# Patient Record
Sex: Female | Born: 1960 | Race: White | Hispanic: No | Marital: Married | Smoking: Never smoker
Health system: Southern US, Community
[De-identification: ages and names within clinical notes are randomized; demographics above are authoritative.]

## PROBLEM LIST (undated history)

## (undated) HISTORY — PX: KNEE SURGERY: SHX244

---

## 2018-07-29 HISTORY — PX: KNEE SURGERY: SHX244

## 2022-01-15 ENCOUNTER — Ambulatory Visit
Admission: EM | Admit: 2022-01-15 | Discharge: 2022-01-15 | Disposition: A | Discharge status: Home / Self Care | Payer: BC Managed Care – PPO | Source: Home / Self Care

## 2022-01-15 DIAGNOSIS — M79602 Pain in left arm: Secondary | ICD-10-CM | POA: Insufficient documentation

## 2022-01-15 DIAGNOSIS — Z20822 Contact with and (suspected) exposure to covid-19: Secondary | ICD-10-CM | POA: Insufficient documentation

## 2022-01-15 DIAGNOSIS — J069 Acute upper respiratory infection, unspecified: Secondary | ICD-10-CM

## 2022-01-15 DIAGNOSIS — A409 Streptococcal sepsis, unspecified: Secondary | ICD-10-CM | POA: Diagnosis not present

## 2022-01-15 DIAGNOSIS — A419 Sepsis, unspecified organism: Secondary | ICD-10-CM | POA: Diagnosis not present

## 2022-01-15 LAB — RESP PANEL BY RT-PCR (FLU A&B, COVID) ARPGX2
Influenza A by PCR: NEGATIVE
Influenza B by PCR: NEGATIVE
SARS Coronavirus 2 by RT PCR: NEGATIVE

## 2022-01-15 MED ORDER — ACETAMINOPHEN 325 MG PO TABS
650.0000 mg | ORAL_TABLET | Freq: Once | ORAL | Status: AC
Start: 2022-01-15 — End: 2022-01-15
  Administered 2022-01-15: 650 mg via ORAL

## 2022-01-15 NOTE — Discharge Instructions (Addendum)
-  COVID and flu swabs negative -given fever and other symptoms, likely a viral upper respiratory infection -can alternate ibuprofen and Tylenol every four hours for pain and fever -over the counter medications for symptom management -push fluids -follow up with PCP as needed

## 2022-01-15 NOTE — ED Provider Notes (Signed)
[facility]    CSN: [id number] Arrival date & time: 01/15/22  1556      History   Chief Complaint Chief Complaint  Patient presents with   Shortness of Breath   Generalized Body Aches   Fatigue   Numbness    Arm numbness     HPI Molly Krueger is a 61 y.o. female.   Patient is a 61 year old female who presents with chief complaint of fever, lethargy, left arm pain and chills.  Patient states that chills have resolved but continues to have fever, lethargy, and left arm pain.  Reports taking ibuprofen mostly for her arm pain as well as low back pain that she had earlier as well.  Patient denies any runny nose or sore throat.  Patient also denies chest pain, shortness of breath, Donnell pain, nausea or dizziness.  Patient states that she has had no sick contacts that she knows of.  She did not get a flu shot this past year but states she has had full set of for COVID shots.  She is not allergic to any medicines that she knows of.    History reviewed. No pertinent past medical history.  There are no problems to display for this patient.   Past Surgical History:  Procedure Laterality Date   KNEE SURGERY Left     OB History   No obstetric history on file.      Home Medications    Prior to Admission medications   Not on File    Family History History reviewed. No pertinent family history.  Social History Social History   Tobacco Use   Smoking status: Never   Smokeless tobacco: Never  Vaping Use   Vaping Use: Never used  Substance Use Topics   Alcohol use: Never   Drug use: Never     Allergies   Patient has no known allergies.   Review of Systems Review of Systems as noted above in HPI.  Other systems reviewed and found to be negative   Physical Exam Triage Vital Signs ED Triage Vitals  Enc Vitals Group     BP 01/15/22 1722 93/69     Pulse Rate 01/15/22 1722 (!) 108     Resp 01/15/22 1722 18     Temp 01/15/22 1722 (!) 101 F  (38.3 C)     Temp Source 01/15/22 1722 Oral     SpO2 01/15/22 1722 95 %     Weight --      Height --      Head Circumference --      Peak Flow --      Pain Score 01/15/22 1719 0     Pain Loc --      Pain Edu? --      Excl. in GC? --    No data found.  Updated Vital Signs BP 93/69 (BP Location: Left Arm)   Pulse (!) 108   Temp (!) 101 F (38.3 C) (Oral)   Resp 18   SpO2 95%     Physical Exam Constitutional:      Appearance: Normal appearance. She is well-developed.  HENT:     Right Ear: Tympanic membrane normal.     Left Ear: Tympanic membrane normal.     Mouth/Throat:     Mouth: Mucous membranes are moist.     Pharynx: Oropharynx is clear.  Cardiovascular:     Rate and Rhythm: Regular rhythm. Tachycardia present.  Pulmonary:     Effort: Pulmonary  effort is normal.     Breath sounds: Normal breath sounds. No wheezing or rhonchi.  Musculoskeletal:     Cervical back: Normal range of motion.  Lymphadenopathy:     Cervical: No cervical adenopathy.  Skin:    General: Skin is warm.     Capillary Refill: Capillary refill takes less than 2 seconds.  Neurological:     General: No focal deficit present.     Mental Status: She is alert and oriented to person, place, and time.  Psychiatric:        Mood and Affect: Mood normal.        Behavior: Behavior normal.      UC Treatments / Results  Labs (all labs ordered are listed, but only abnormal results are displayed) Labs Reviewed  RESP PANEL BY RT-PCR (FLU A&B, COVID) [id number]  SARS CORONAVIRUS 2 (TAT 6-24 HRS)    EKG   Radiology No results found.  Procedures Procedures (including critical care time)  Medications Ordered in UC Medications  acetaminophen (TYLENOL) tablet 650 mg (650 mg Oral Given 01/15/22 1729)    Initial Impression / Assessment and Plan / UC Course  I have reviewed the triage vital signs and the nursing notes.  Pertinent labs & imaging results that were available during my care of the  patient were reviewed by me and considered in my medical decision making (see chart for details).     Patient presents with temperature of 101.  Patient reporting viral symptoms for 5 days.  She denies any sick contacts that she knows of.  Main complaints of fever, lethargy, left arm pain.  Viral swab obtained, and results negative. Will recommend symptom management for viral illness.  Final Clinical Impressions(s) / UC Diagnoses   Final diagnoses:  Viral URI     Discharge Instructions      -COVID and flu swabs negative -given fever and other symptoms, likely a viral upper respiratory infection -can alternate ibuprofen and Tylenol every four hours for pain and fever -over the counter medications for symptom management -push fluids -follow up with PCP as needed     ED Prescriptions   None    PDMP not reviewed this encounter.   Candis Schatz, PA-C 01/15/22 1835

## 2022-01-15 NOTE — ED Triage Notes (Signed)
Patient presents to Urgent Care with complaints of SOB with activity, decreased strength in left arm, fatigue, and body aches since yesterday. Friday had chills, fever. Treating symptoms with Ibuprofen. Last dose yesterday.

## 2022-01-16 ENCOUNTER — Emergency Department: Payer: BC Managed Care – PPO

## 2022-01-16 ENCOUNTER — Inpatient Hospital Stay
Admission: EM | Admit: 2022-01-16 | Discharge: 2022-01-22 | DRG: 854 | Disposition: A | Discharge status: Home Health | Payer: BC Managed Care – PPO | Attending: Hospitalist | Admitting: Hospitalist

## 2022-01-16 ENCOUNTER — Other Ambulatory Visit: Payer: Self-pay

## 2022-01-16 DIAGNOSIS — Z20822 Contact with and (suspected) exposure to covid-19: Secondary | ICD-10-CM | POA: Diagnosis present

## 2022-01-16 DIAGNOSIS — E876 Hypokalemia: Secondary | ICD-10-CM

## 2022-01-16 DIAGNOSIS — M00222 Other streptococcal arthritis, left elbow: Secondary | ICD-10-CM | POA: Diagnosis not present

## 2022-01-16 DIAGNOSIS — A409 Streptococcal sepsis, unspecified: Secondary | ICD-10-CM | POA: Diagnosis present

## 2022-01-16 DIAGNOSIS — N179 Acute kidney failure, unspecified: Secondary | ICD-10-CM | POA: Diagnosis present

## 2022-01-16 DIAGNOSIS — M25422 Effusion, left elbow: Secondary | ICD-10-CM

## 2022-01-16 DIAGNOSIS — B955 Unspecified streptococcus as the cause of diseases classified elsewhere: Secondary | ICD-10-CM

## 2022-01-16 DIAGNOSIS — R509 Fever, unspecified: Secondary | ICD-10-CM | POA: Diagnosis not present

## 2022-01-16 DIAGNOSIS — R7881 Bacteremia: Secondary | ICD-10-CM | POA: Diagnosis not present

## 2022-01-16 DIAGNOSIS — A419 Sepsis, unspecified organism: Secondary | ICD-10-CM | POA: Diagnosis present

## 2022-01-16 DIAGNOSIS — M009 Pyogenic arthritis, unspecified: Secondary | ICD-10-CM | POA: Diagnosis present

## 2022-01-16 DIAGNOSIS — E871 Hypo-osmolality and hyponatremia: Secondary | ICD-10-CM

## 2022-01-16 DIAGNOSIS — E860 Dehydration: Secondary | ICD-10-CM | POA: Diagnosis present

## 2022-01-16 DIAGNOSIS — R6520 Severe sepsis without septic shock: Secondary | ICD-10-CM | POA: Diagnosis not present

## 2022-01-16 DIAGNOSIS — D696 Thrombocytopenia, unspecified: Secondary | ICD-10-CM | POA: Diagnosis present

## 2022-01-16 DIAGNOSIS — M65122 Other infective (teno)synovitis, left elbow: Secondary | ICD-10-CM | POA: Diagnosis present

## 2022-01-16 DIAGNOSIS — I700 Atherosclerosis of aorta: Secondary | ICD-10-CM | POA: Diagnosis present

## 2022-01-16 LAB — BASIC METABOLIC PANEL
Anion gap: 11 (ref 5–15)
BUN: 35 mg/dL — ABNORMAL HIGH (ref 8–23)
CO2: 20 mmol/L — ABNORMAL LOW (ref 22–32)
Calcium: 8.6 mg/dL — ABNORMAL LOW (ref 8.9–10.3)
Chloride: 98 mmol/L (ref 98–111)
Creatinine, Ser: 1.21 mg/dL — ABNORMAL HIGH (ref 0.44–1.00)
GFR, Estimated: 51 mL/min — ABNORMAL LOW (ref >60)
Glucose, Bld: 149 mg/dL — ABNORMAL HIGH (ref 70–99)
Potassium: 3.7 mmol/L (ref 3.5–5.1)
Sodium: 129 mmol/L — ABNORMAL LOW (ref 135–145)

## 2022-01-16 LAB — CBC
HCT: 34.3 % — ABNORMAL LOW (ref 36.0–46.0)
Hemoglobin: 12.2 g/dL (ref 12.0–15.0)
MCH: 33.2 pg (ref 26.0–34.0)
MCHC: 35.6 g/dL (ref 30.0–36.0)
MCV: 93.2 fL (ref 80.0–100.0)
Platelets: 60 10*3/uL — ABNORMAL LOW (ref 150–400)
RBC: 3.68 MIL/uL — ABNORMAL LOW (ref 3.87–5.11)
RDW: 13.5 % (ref 11.5–15.5)
WBC: 9.8 10*3/uL (ref 4.0–10.5)
nRBC: 0 % (ref 0.0–0.2)

## 2022-01-16 LAB — LACTIC ACID, PLASMA
Lactic Acid, Venous: 1.4 mmol/L (ref 0.5–1.9)
Lactic Acid, Venous: 1.2 mmol/L (ref 0.5–1.9)

## 2022-01-16 LAB — URINALYSIS, ROUTINE W REFLEX MICROSCOPIC
Bilirubin Urine: NEGATIVE
Glucose, UA: NEGATIVE mg/dL
Ketones, ur: 5 mg/dL — AB
Nitrite: NEGATIVE
Protein, ur: 100 mg/dL — AB
Specific Gravity, Urine: 1.019 (ref 1.005–1.030)
WBC, UA: >50 WBC/hpf — ABNORMAL HIGH (ref 0–5)
pH: 5 (ref 5.0–8.0)

## 2022-01-16 LAB — HEPATIC FUNCTION PANEL
ALT: 52 U/L — ABNORMAL HIGH (ref 0–44)
AST: 27 U/L (ref 15–41)
Albumin: 3.2 g/dL — ABNORMAL LOW (ref 3.5–5.0)
Alkaline Phosphatase: 98 U/L (ref 38–126)
Bilirubin, Direct: 0.3 mg/dL — ABNORMAL HIGH (ref 0.0–0.2)
Indirect Bilirubin: 1 mg/dL — ABNORMAL HIGH (ref 0.3–0.9)
Total Bilirubin: 1.3 mg/dL — ABNORMAL HIGH (ref 0.3–1.2)
Total Protein: 7.1 g/dL (ref 6.5–8.1)

## 2022-01-16 LAB — CK: Total CK: 33 U/L — ABNORMAL LOW (ref 38–234)

## 2022-01-16 LAB — PROCALCITONIN: Procalcitonin: 4.54 ng/mL

## 2022-01-16 IMAGING — CT CT CHEST-ABD-PELV W/O CM
2 of 4 series · 13 of 36 positions shown, 15 images · non-contrast
Comparison: None Available.

CLINICAL DATA: 61-year-old female with acute chest, abdominal and
pelvic pain with chills and body aches.



[Series 2: cap wo st · axial · 0.76mm/px · z∈[-1226,-701]mm · 10 of 123 slices shown, 12 images]
[im 9/123  mediastinal]
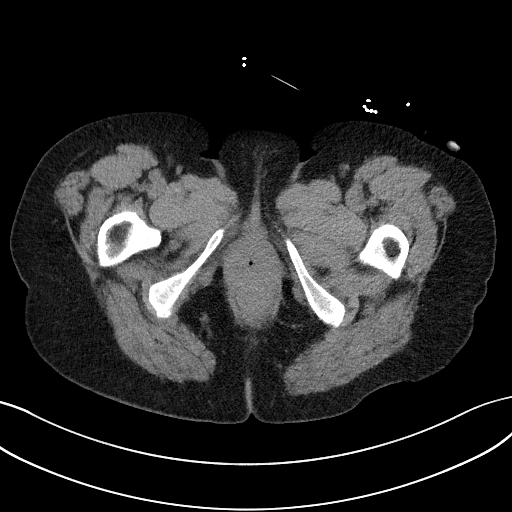
[im 9/123  bone]
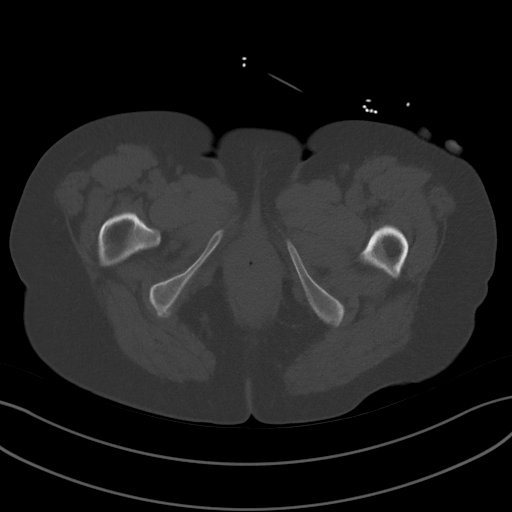
[im 18/123  mediastinal]
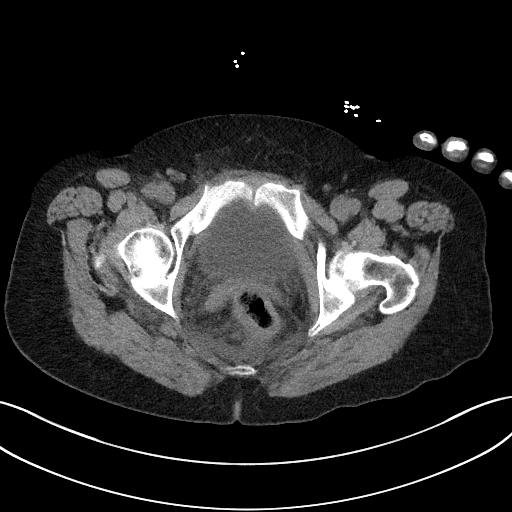
[im 35/123  mediastinal]
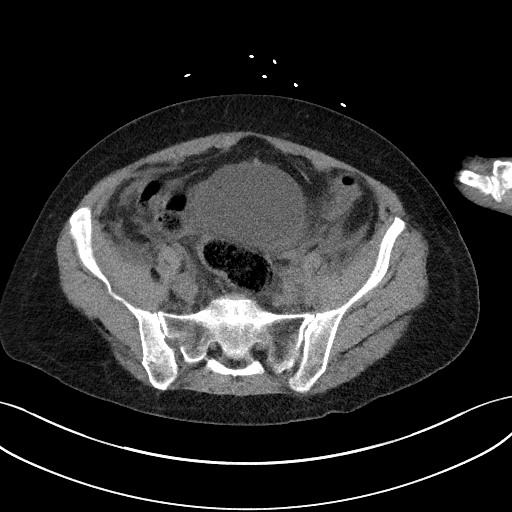
[im 44/123  mediastinal]
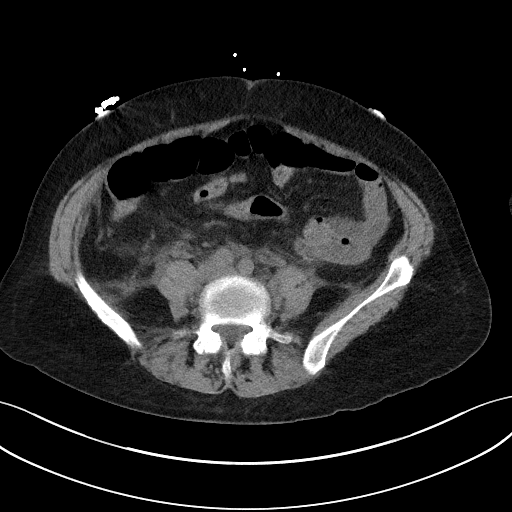
[im 53/123  mediastinal]
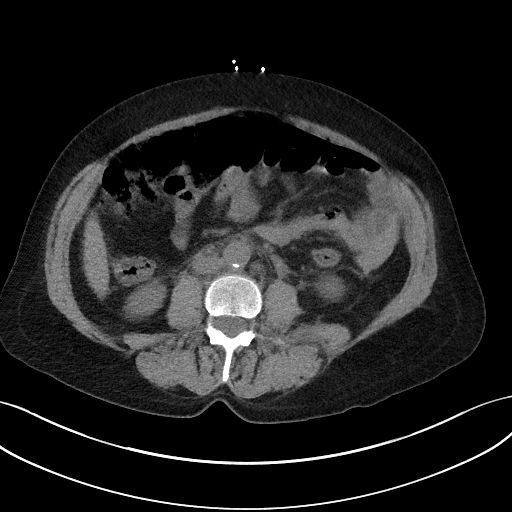
[im 70/123  mediastinal]
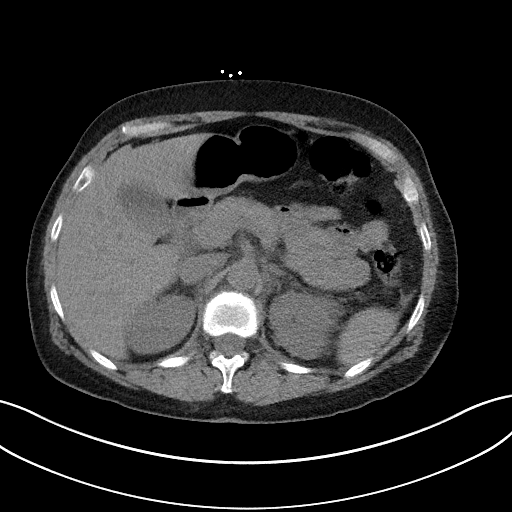
[im 79/123  mediastinal]
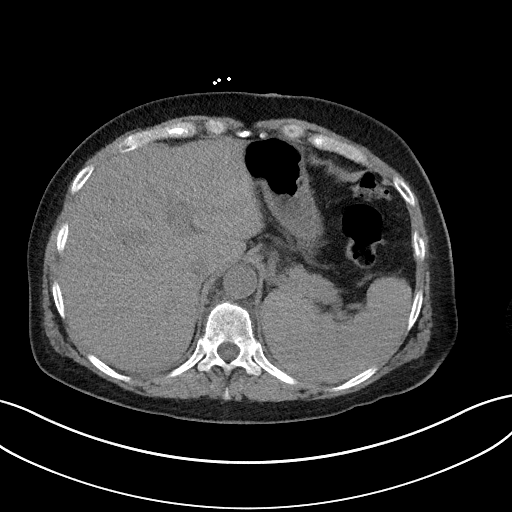
[im 88/123  mediastinal]
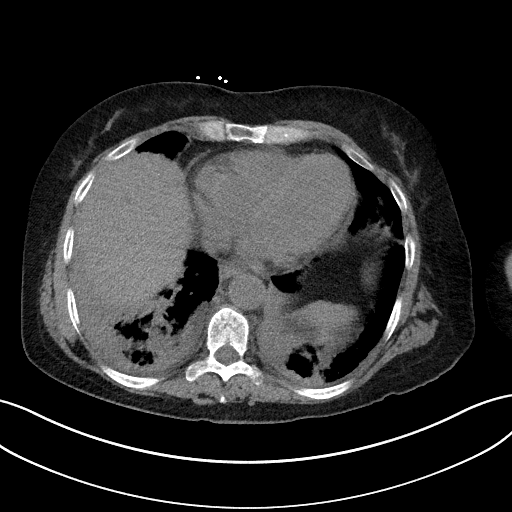
[im 105/123  mediastinal]
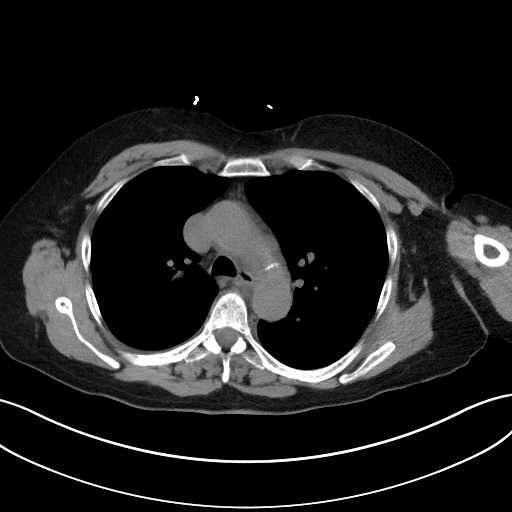
[im 105/123  bone]
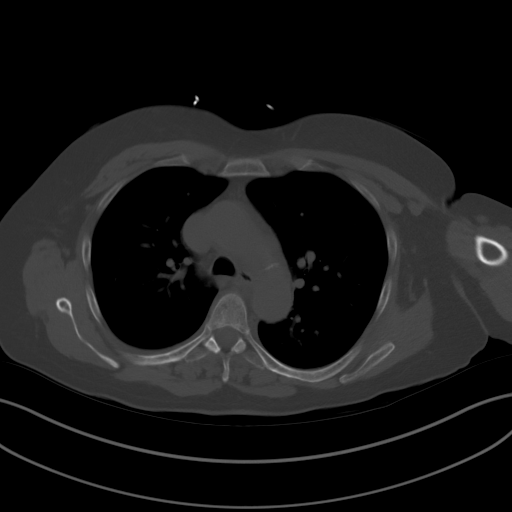
[im 114/123  mediastinal]
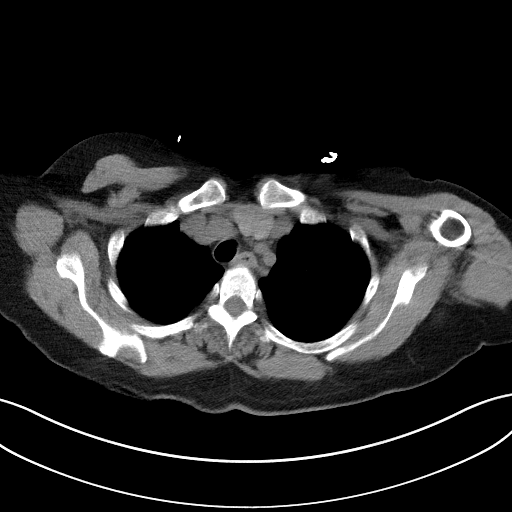

[Series 5: coronal · coronal · 0.72mm/px · 3 of 138 slices shown]
[im 28/138  mediastinal]
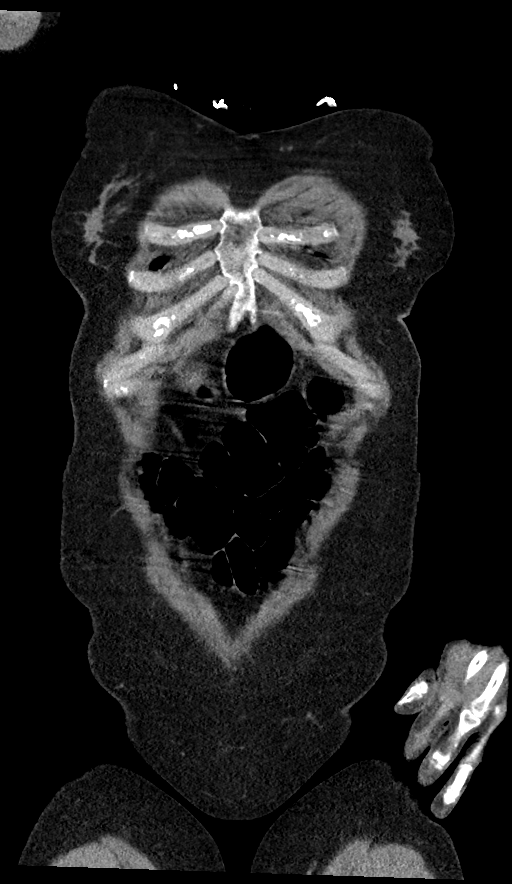
[im 55/138  mediastinal]
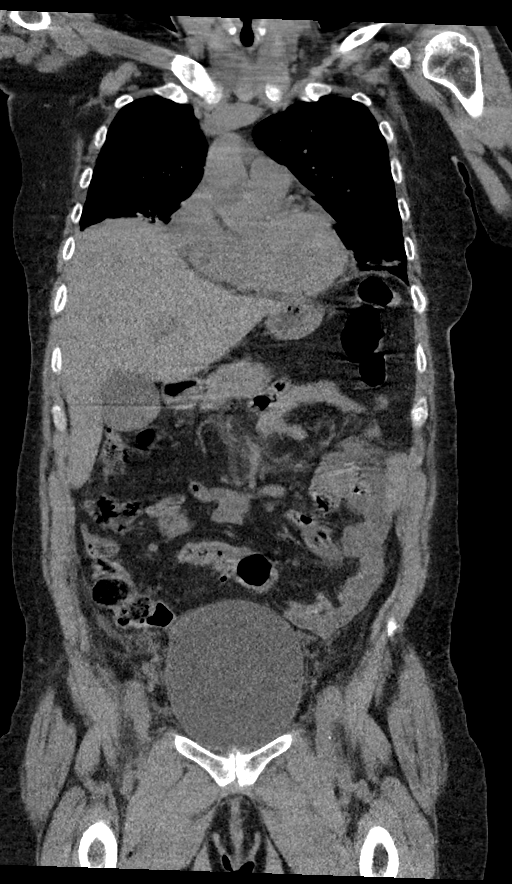
[im 83/138  mediastinal]
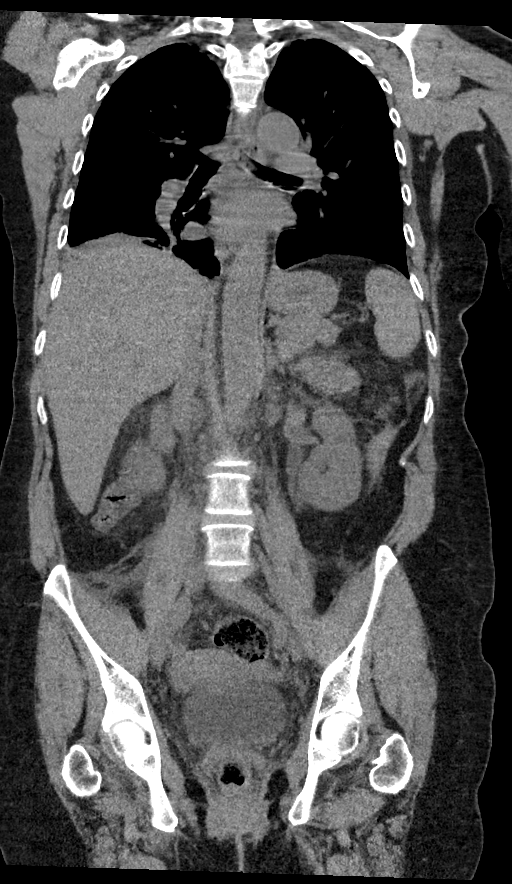

[13 of 36 positions shown; findings below may reference images not displayed]

FINDINGS: Please note that parenchymal and vascular abnormalities may be
missed as intravenous contrast was not administered.

CT CHEST FINDINGS

Cardiovascular: No significant vascular findings. Normal heart size.
No pericardial effusion.

Mediastinum/Nodes: No enlarged mediastinal, hilar, or axillary lymph
nodes. Thyroid gland, trachea, and esophagus demonstrate no
significant findings.

Lungs/Pleura: Moderate bilateral LOWER lobe opacities cast question
atelectasis and/or consolidation/pneumonia. Mild to moderate RIGHT
middle lobe and lingular atelectasis noted.

Trace bilateral pleural effusions are present. There is no evidence
of pneumothorax.

Musculoskeletal: No acute or suspicious bony abnormalities are
noted.

CT ABDOMEN PELVIS FINDINGS

Hepatobiliary: The liver is unremarkable. Dependent high density
within the gallbladder may represent sludge versus cholelithiasis.
No CT evidence of acute cholecystitis identified. There is no
evidence of intrahepatic or extrahepatic biliary dilatation.

Pancreas: Unremarkable

Spleen: UPPER limits normal spleen size noted.

Adrenals/Urinary Tract: The kidneys, adrenal glands and bladder are
unremarkable except for a probable LEFT renal cyst which need no
follow-up.

Stomach/Bowel: Stomach is within normal limits. No evidence of bowel
wall thickening, distention, or inflammatory changes.

Vascular/Lymphatic: Aortic atherosclerosis. No enlarged abdominal or
pelvic lymph nodes.

Reproductive: Uterus and bilateral adnexa are unremarkable.

Other: Stranding/mildly enlarged lymph nodes in the periaortic
region identified with an index aortocaval node measuring 1.1 cm
(series 2: Image 61). This stranding extends down bilateral
posterior retroperitoneal regions and there is a small amount of
fluid/stranding in the presacral/pre coccygeal region.

There is no evidence of focal collection pneumoperitoneum.

Musculoskeletal: No acute or suspicious bony abnormalities are
noted.
IMPRESSION: 1. Moderate bilateral LOWER lobe opacities, question atelectasis
and/or consolidation/pneumonia. Mild to moderate RIGHT middle lobe
and lingular atelectasis and trace bilateral pleural effusions.
2. Stranding/mildly enlarged lymph nodes in the periaortic region
extending into the pelvic retroperitoneum regions with a small
amount of fluid/stranding in the presacral/pre coccygeal region.
These findings are nonspecific but may represent an infectious or
inflammatory process.
3. Dependent high density within the gallbladder may represent
sludge versus cholelithiasis. No CT evidence of acute cholecystitis.
4. Aortic Atherosclerosis ([2I]-[2I]).

## 2022-01-16 IMAGING — CR DG CHEST 2V
2 series · 2 of 2 positions shown · non-contrast
Comparison: None Available.

CLINICAL DATA: Fever x5 days

EXAM:
CHEST - 2 VIEW

[chest lat]
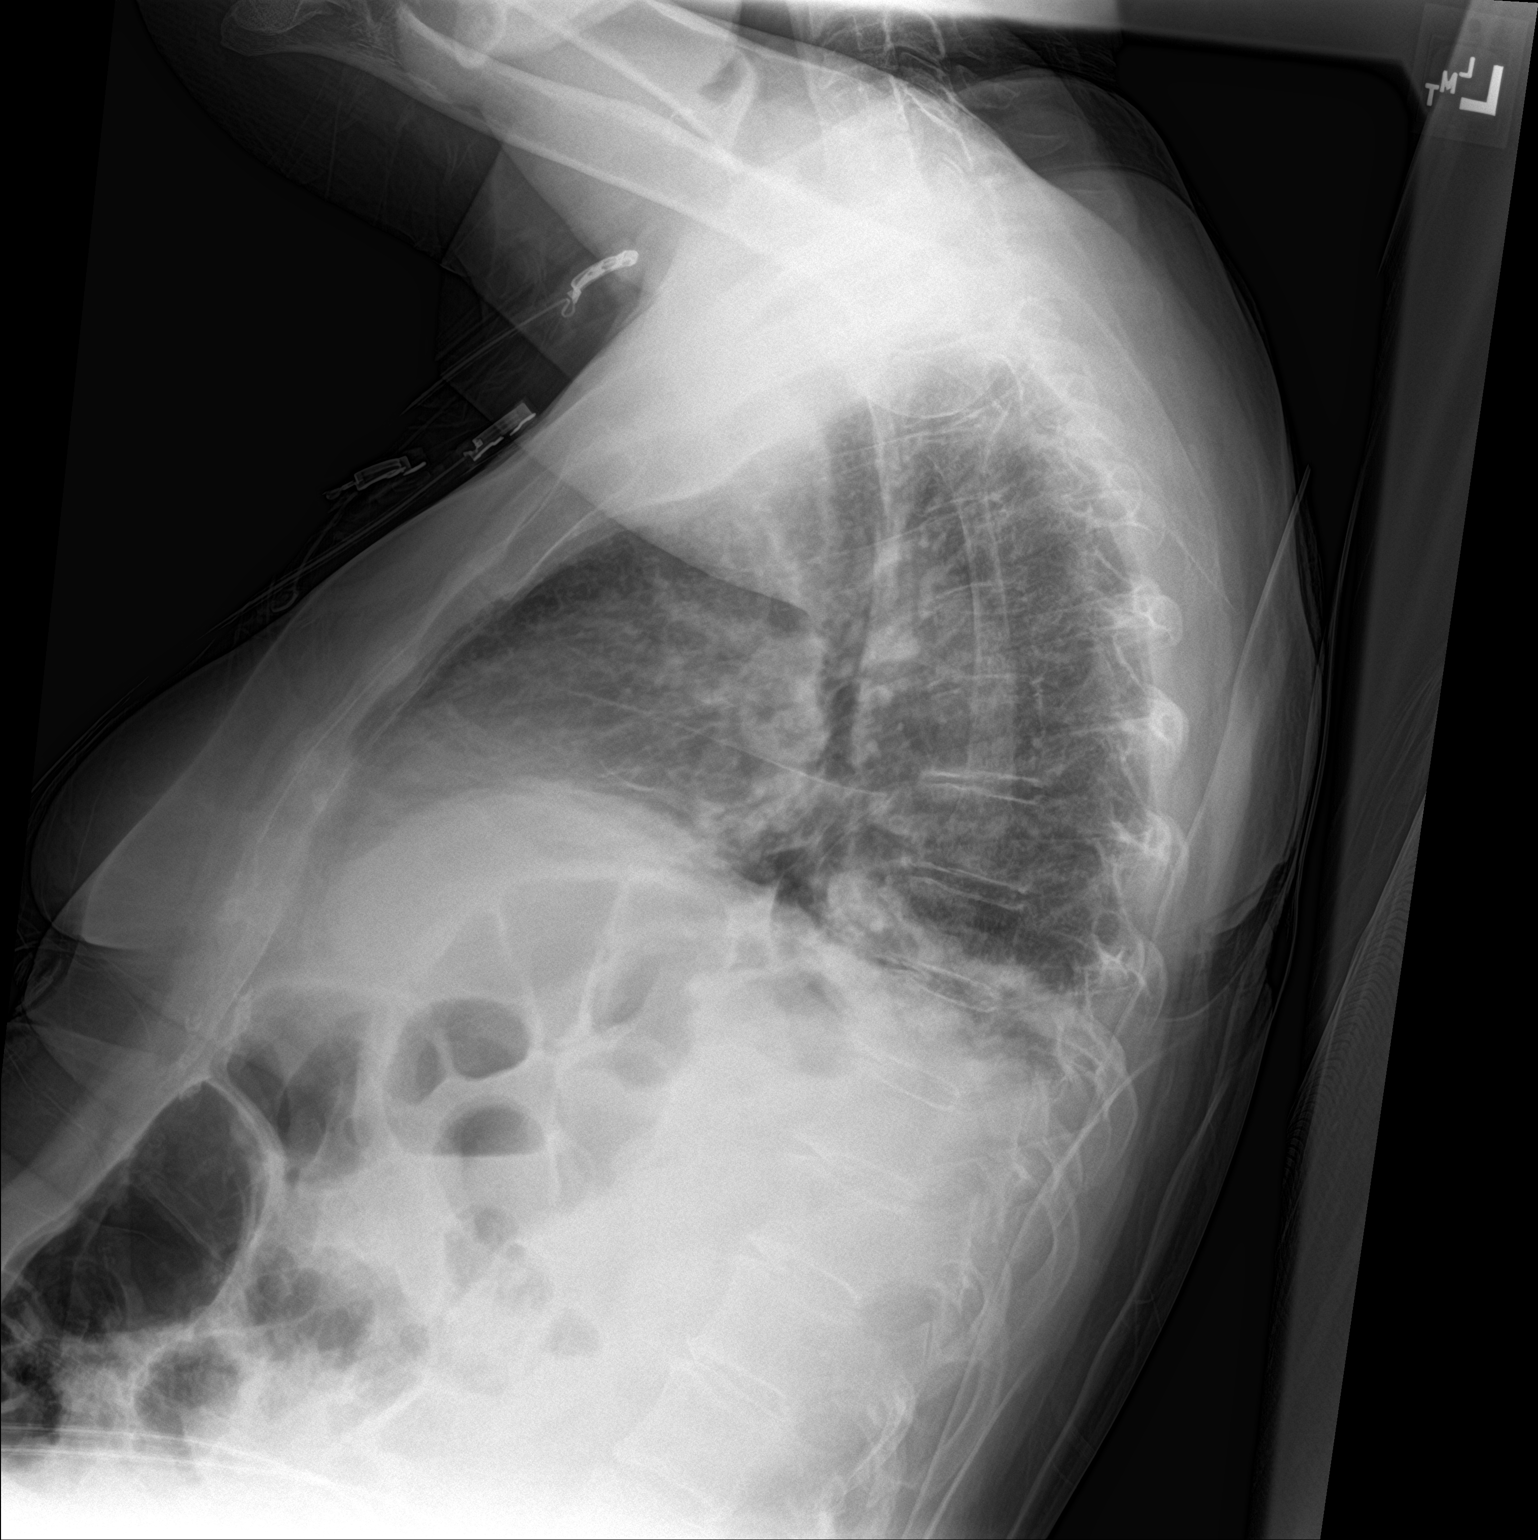

[chest ap]
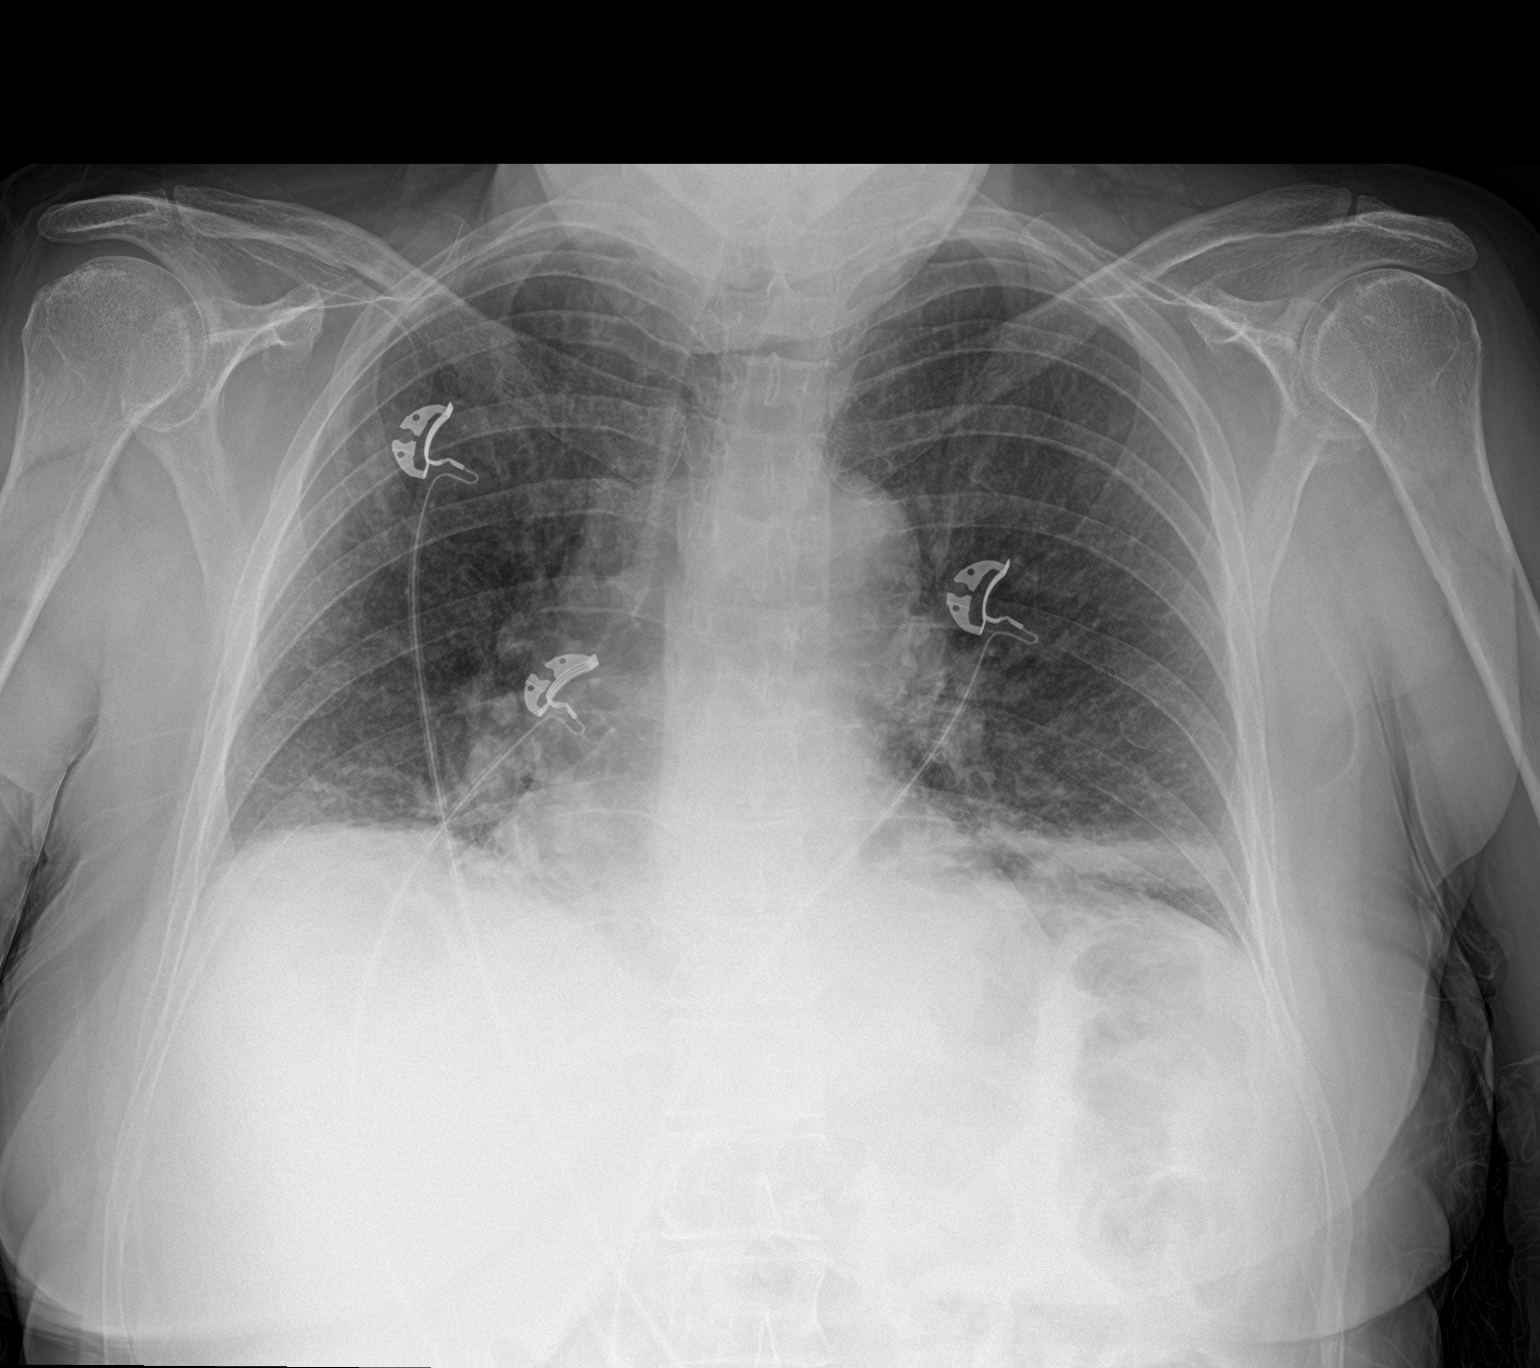

[2 of 2 positions shown; findings below may reference images not displayed]

FINDINGS: Cardiac size is within normal limits. Thoracic aorta is tortuous.
There is poor inspiration. Linear patchy infiltrates are seen in
both lower lung fields. There are no signs of alveolar pulmonary
edema. There is possible minimal right pleural effusion. There is no
pneumothorax.
IMPRESSION: Linear patchy infiltrates are seen in the both lower lung fields
suggesting atelectasis/pneumonia. Possible minimal right pleural
effusion.

## 2022-01-16 IMAGING — US US EXTREM  UP VENOUS*L*
1 series · 13 of 24 positions shown · non-contrast
Comparison: None Available.

CLINICAL DATA: swelling left upper extremity with pain



[Series 1: us venous img upper uni left (dvt) · portal-venous · 13 of 30 slices shown]
[im 1/30]
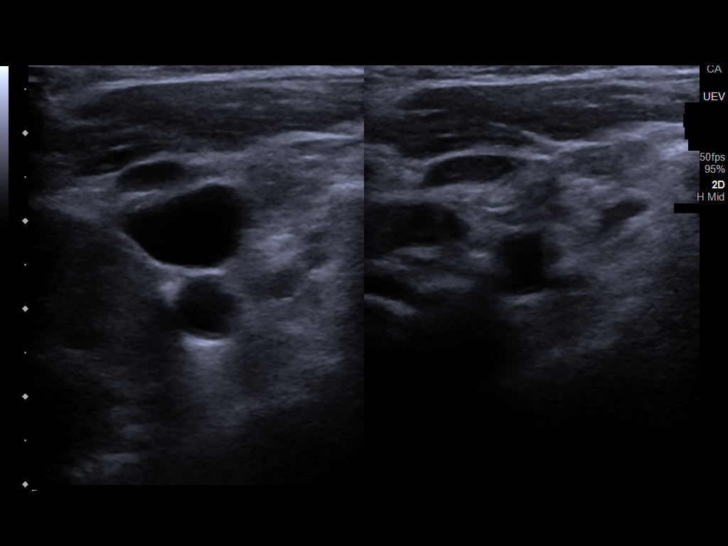
[im 3/30]
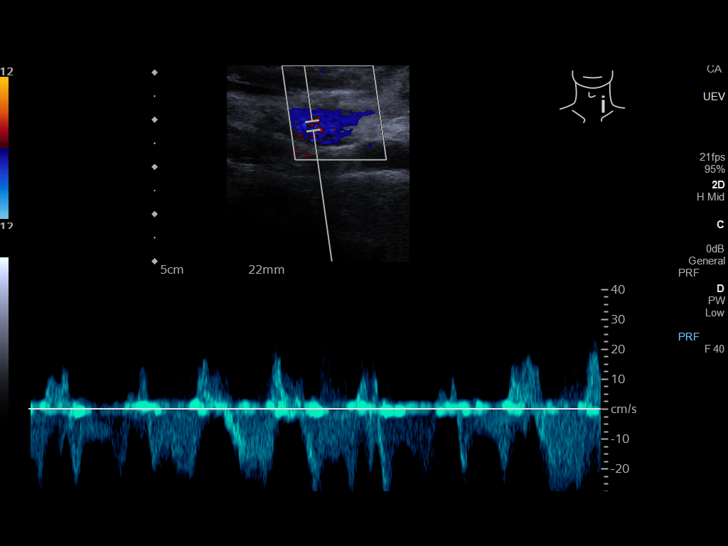
[im 6/30]
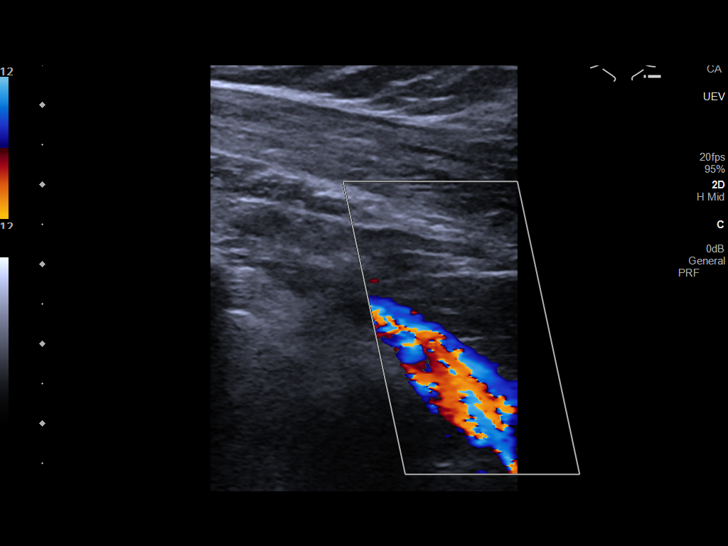
[im 8/30]
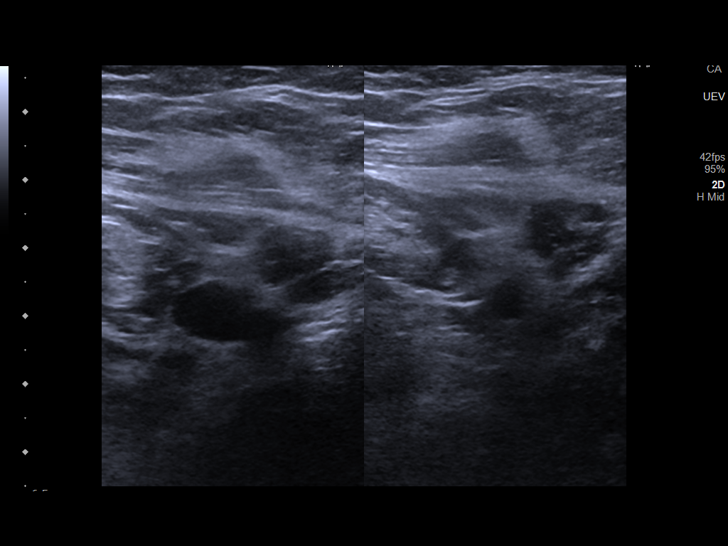
[im 11/30]
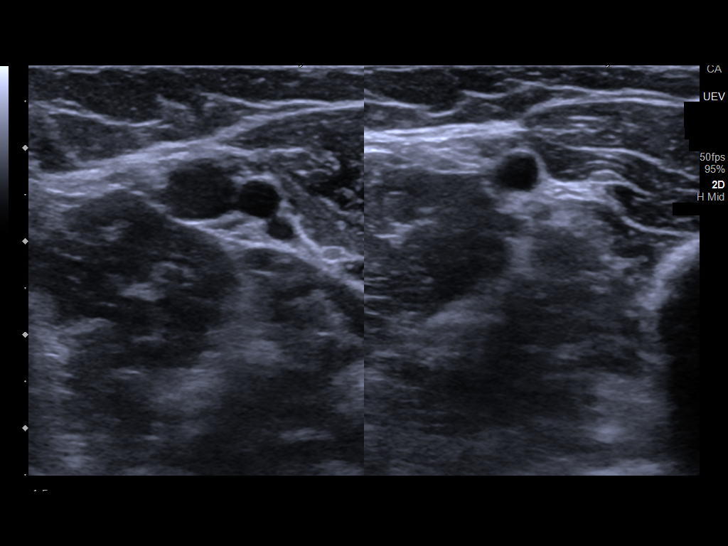
[im 13/30]
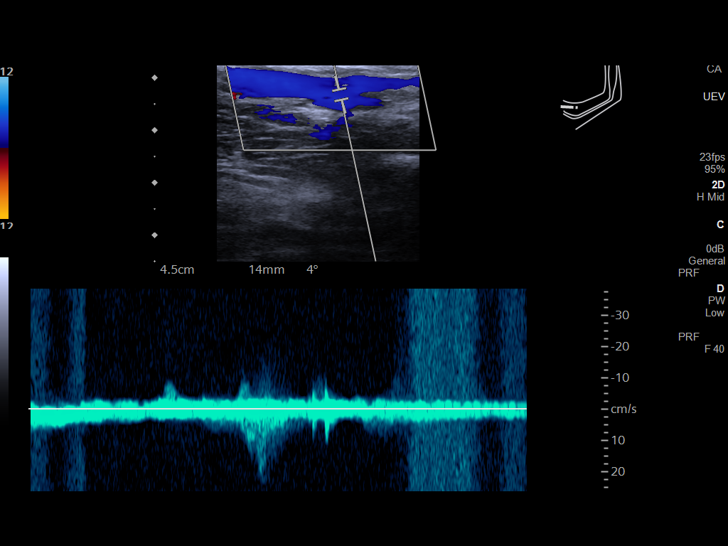
[im 16/30]
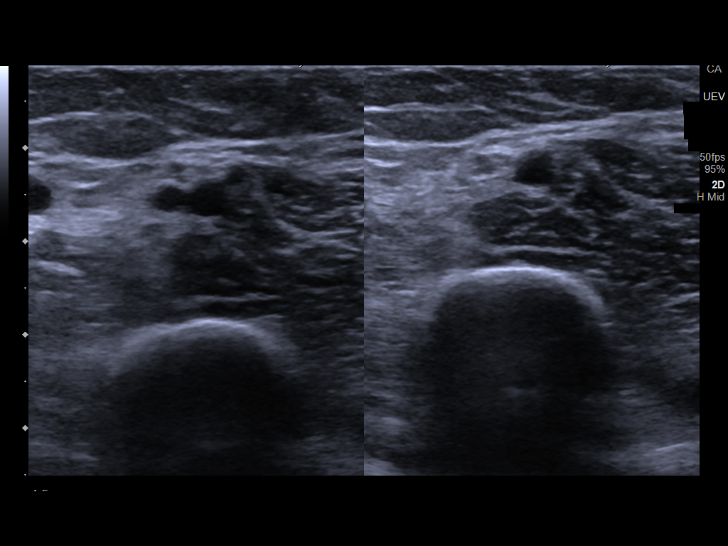
[im 17/30]
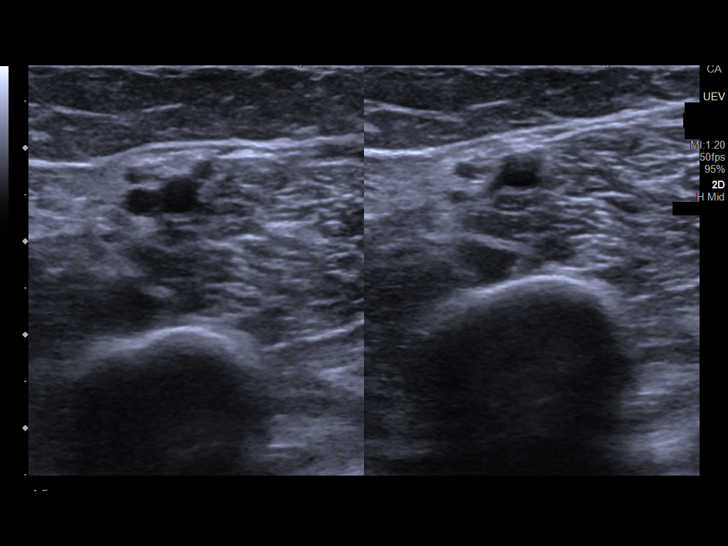
[im 19/30]
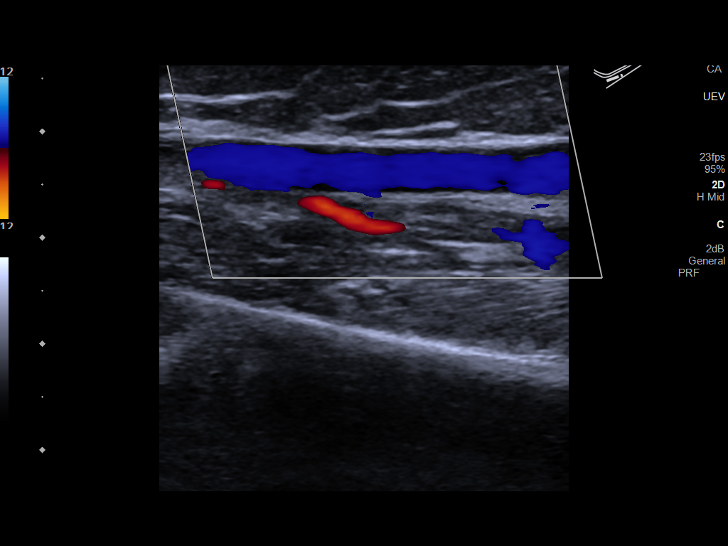
[im 22/30]
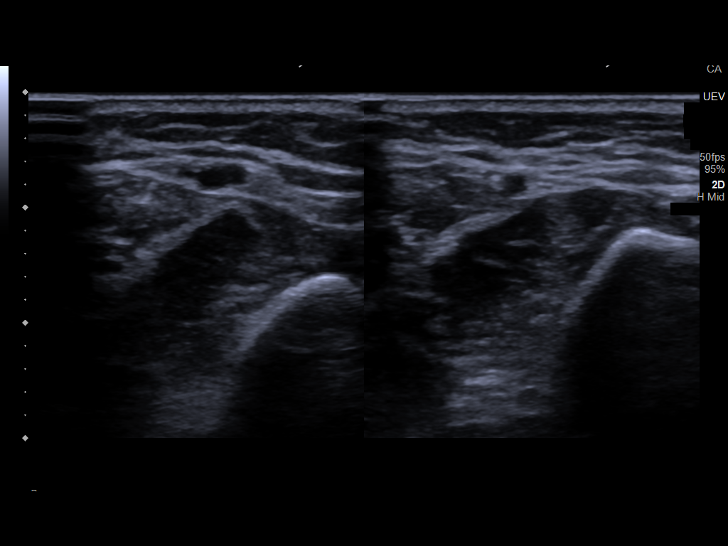
[im 24/30]
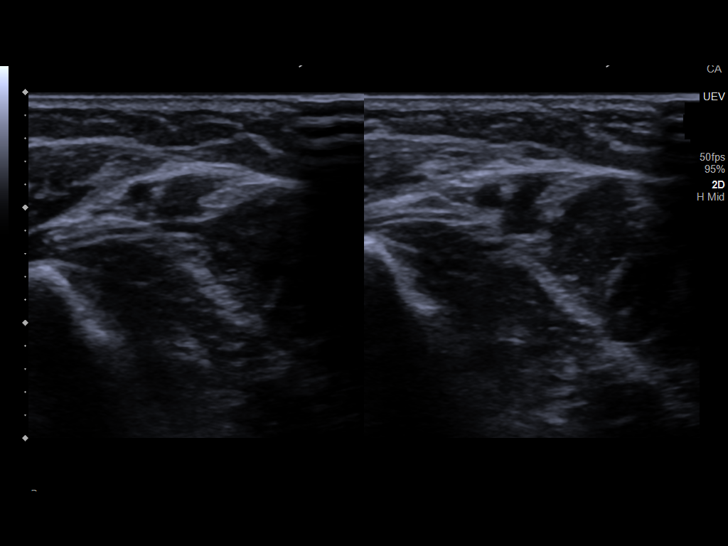
[im 27/30]
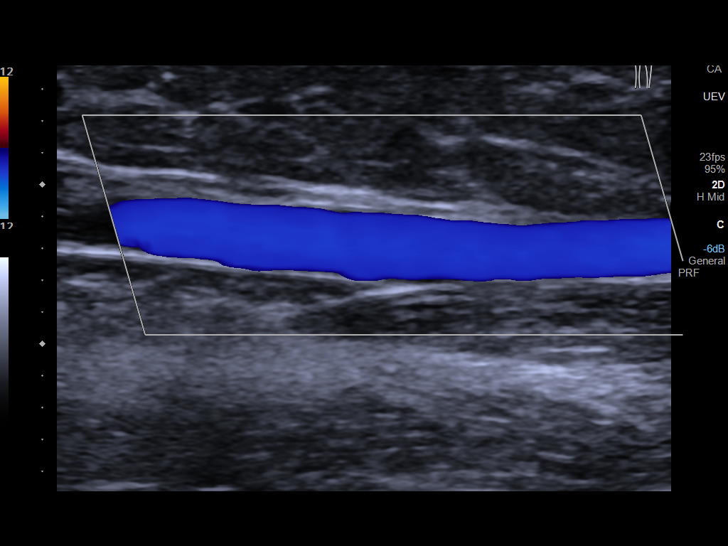
[im 30/30]
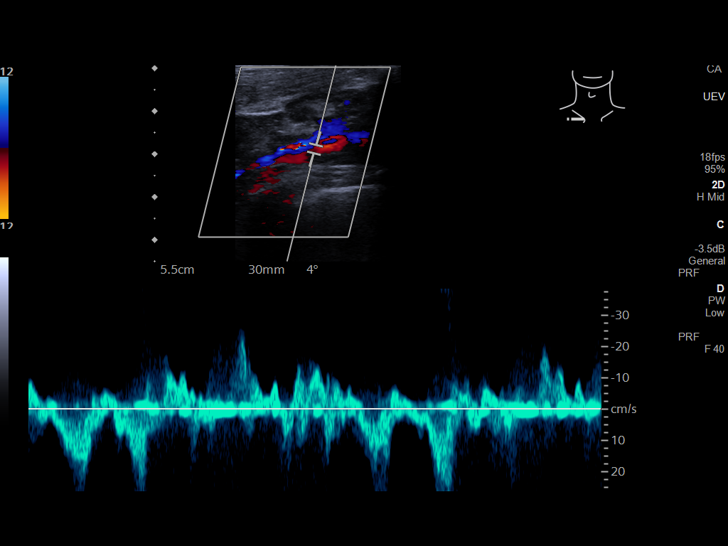

[13 of 24 positions shown; findings below may reference images not displayed]

FINDINGS: Contralateral Subclavian Vein: Respiratory phasicity is normal and
symmetric with the symptomatic side. No evidence of thrombus. Normal
compressibility.

Internal Jugular Vein: No evidence of thrombus. Normal
compressibility, respiratory phasicity and response to augmentation.

Subclavian Vein: No evidence of thrombus. Normal compressibility,
respiratory phasicity and response to augmentation.

Axillary Vein: No evidence of thrombus. Normal compressibility,
respiratory phasicity and response to augmentation.

Cephalic Vein: No evidence of thrombus. Normal compressibility,
respiratory phasicity and response to augmentation.

Basilic Vein: No evidence of thrombus. Normal compressibility,
respiratory phasicity and response to augmentation.

Brachial Veins: No evidence of thrombus. Normal compressibility,
respiratory phasicity and response to augmentation.

Radial Veins: No evidence of thrombus. Normal compressibility,
respiratory phasicity and response to augmentation.

Ulnar Veins: No evidence of thrombus. Normal compressibility,
respiratory phasicity and response to augmentation.

Venous Reflux:  None visualized.

Other Findings:  None visualized.
IMPRESSION: No evidence of DVT within the left upper extremity.

## 2022-01-16 MED ORDER — ONDANSETRON HCL 4 MG PO TABS: 4.0000 mg | ORAL_TABLET | Freq: Four times a day (QID) | ORAL | Status: DC | PRN

## 2022-01-16 MED ORDER — LACTATED RINGERS IV SOLN: INTRAVENOUS | Status: DC

## 2022-01-16 MED ORDER — SODIUM CHLORIDE 0.9 % IV BOLUS
1000.0000 mL | Freq: Once | INTRAVENOUS | Status: AC
  Administered 2022-01-16: 1000 mL via INTRAVENOUS

## 2022-01-16 MED ORDER — CEFTRIAXONE SODIUM 2 G IJ SOLR
2.0000 g | INTRAMUSCULAR | Status: DC
  Administered 2022-01-17 – 2022-01-22 (×6): 2 g via INTRAVENOUS
  Filled 2022-01-16 (×5): qty 2
  Filled 2022-01-16: qty 20

## 2022-01-16 MED ORDER — ACETAMINOPHEN 500 MG PO TABS
1000.0000 mg | ORAL_TABLET | Freq: Once | ORAL | Status: AC
  Administered 2022-01-16: 1000 mg via ORAL
  Filled 2022-01-16: qty 2

## 2022-01-16 MED ORDER — ONDANSETRON HCL 4 MG/2ML IJ SOLN: 4.0000 mg | Freq: Four times a day (QID) | INTRAMUSCULAR | Status: DC | PRN

## 2022-01-16 MED ORDER — VANCOMYCIN HCL IN DEXTROSE 1-5 GM/200ML-% IV SOLN
1000.0000 mg | Freq: Once | INTRAVENOUS | Status: AC
  Administered 2022-01-16: 1000 mg via INTRAVENOUS
  Filled 2022-01-16: qty 200

## 2022-01-16 MED ORDER — METRONIDAZOLE 500 MG/100ML IV SOLN
500.0000 mg | Freq: Once | INTRAVENOUS | Status: AC
  Administered 2022-01-16: 500 mg via INTRAVENOUS
  Filled 2022-01-16: qty 100

## 2022-01-16 MED ORDER — OXYCODONE HCL 5 MG PO TABS
5.0000 mg | ORAL_TABLET | Freq: Once | ORAL | Status: AC
  Administered 2022-01-16: 5 mg via ORAL
  Filled 2022-01-16: qty 1

## 2022-01-16 MED ORDER — CEFEPIME HCL 2 G IV SOLR
2.0000 g | Freq: Once | INTRAVENOUS | Status: AC
  Administered 2022-01-16: 2 g via INTRAVENOUS
  Filled 2022-01-16: qty 12.5

## 2022-01-16 MED ORDER — ACETAMINOPHEN 650 MG RE SUPP: 650.0000 mg | Freq: Four times a day (QID) | RECTAL | Status: DC | PRN

## 2022-01-16 MED ORDER — SODIUM CHLORIDE 0.9% FLUSH
3.0000 mL | Freq: Two times a day (BID) | INTRAVENOUS | Status: DC
  Administered 2022-01-16 – 2022-01-22 (×11): 3 mL via INTRAVENOUS

## 2022-01-16 MED ORDER — SODIUM CHLORIDE 0.9 % IV SOLN: INTRAVENOUS | Status: AC

## 2022-01-16 MED ORDER — POLYETHYLENE GLYCOL 3350 17 G PO PACK
17.0000 g | PACK | Freq: Every day | ORAL | Status: DC | PRN
  Administered 2022-01-17: 17 g via ORAL
  Filled 2022-01-16: qty 1

## 2022-01-16 MED ORDER — ACETAMINOPHEN 325 MG PO TABS
650.0000 mg | ORAL_TABLET | Freq: Four times a day (QID) | ORAL | Status: DC | PRN
  Administered 2022-01-16 – 2022-01-17 (×2): 650 mg via ORAL
  Filled 2022-01-16 (×3): qty 2

## 2022-01-16 MED ORDER — PNEUMOCOCCAL 20-VAL CONJ VACC 0.5 ML IM SUSY
0.5000 mL | PREFILLED_SYRINGE | INTRAMUSCULAR | Status: DC
  Filled 2022-01-16: qty 0.5

## 2022-01-16 NOTE — ED Notes (Signed)
Pt in bed, pt reports a decrease in pain, pt states that her bp is normally low, md notified of bp

## 2022-01-16 NOTE — ED Notes (Signed)
Blood culture number one from L hand via 23 gauge butterfly, blood culture number two from iv start in R wrist, blood return was very slow.

## 2022-01-16 NOTE — Consult Note (Signed)
Reason for Consult: Left elbow pain possible sepsis Referring Physician: Dr Linnell Fulling Molly Krueger is an 61 y.o. female.  HPI: Patient has noticed 2 to 3 days of increasing stiffness and pain to the left elbow.  She had elbow x-rays that showed an effusion there is concern with her having general sepsis at her elbow could be the site of the infection.  She denies any recent injury to her elbow.  She notices trouble straightening and bending and using her arm since the weekend.  History reviewed. No pertinent past medical history.  Past Surgical History:  Procedure Laterality Date   KNEE SURGERY Left    KNEE SURGERY  2020   2020-right,    History reviewed. No pertinent family history.  Social History:  reports that she has never smoked. She has never used smokeless tobacco. She reports that she does not drink alcohol and does not use drugs.  Allergies: No Known Allergies  Medications: I have reviewed the patient's current medications.  Results for orders placed or performed during the hospital encounter of 01/16/22 (from the past 48 hour(s))  Basic metabolic panel     Status: Abnormal   Collection Time: 01/16/22 11:30 AM  Result Value Ref Range   Sodium 129 (L) 135 - 145 mmol/L   Potassium 3.7 3.5 - 5.1 mmol/L   Chloride 98 98 - 111 mmol/L   CO2 20 (L) 22 - 32 mmol/L   Glucose, Bld 149 (H) 70 - 99 mg/dL    Comment: Glucose reference range applies only to samples taken after fasting for at least 8 hours.   BUN 35 (H) 8 - 23 mg/dL   Creatinine, Ser 7.40 (H) 0.44 - 1.00 mg/dL   Calcium 8.6 (L) 8.9 - 10.3 mg/dL   GFR, Estimated 51 (L) >60 mL/min    Comment: (NOTE) Calculated using the CKD-EPI Creatinine Equation (2021)    Anion gap 11 5 - 15    Comment: Performed at [facility], [address] [place], [place] [address]  CBC     Status: Abnormal   Collection Time: 01/16/22 11:30 AM  Result Value Ref Range   WBC 9.8 4.0 - 10.5 K/uL   RBC 3.68 (L) 3.87 - 5.11  MIL/uL   Hemoglobin 12.2 12.0 - 15.0 g/dL   HCT 18.5 (L) 63.1 - 49.7 %   MCV 93.2 80.0 - 100.0 fL   MCH 33.2 26.0 - 34.0 pg   MCHC 35.6 30.0 - 36.0 g/dL   RDW 02.6 37.8 - 58.8 %   Platelets 60 (L) 150 - 400 K/uL    Comment: Immature Platelet Fraction may be clinically indicated, consider ordering this additional test [id number] PLATELET COUNT CONFIRMED BY SMEAR    nRBC 0.0 0.0 - 0.2 %    Comment: Performed at [facility], [address] [place], [place] [address]  Urinalysis, Routine w reflex microscopic     Status: Abnormal   Collection Time: 01/16/22 11:30 AM  Result Value Ref Range   Color, Urine YELLOW (A) YELLOW   APPearance CLOUDY (A) CLEAR   Specific Gravity, Urine 1.019 1.005 - 1.030   pH 5.0 5.0 - 8.0   Glucose, UA NEGATIVE NEGATIVE mg/dL   Hgb urine dipstick MODERATE (A) NEGATIVE   Bilirubin Urine NEGATIVE NEGATIVE   Ketones, ur 5 (A) NEGATIVE mg/dL   Protein, ur 672 (A) NEGATIVE mg/dL   Nitrite NEGATIVE NEGATIVE   Leukocytes,Ua LARGE (A) NEGATIVE   RBC / HPF 6-10 0 -  5 RBC/hpf   WBC, UA >50 (H) 0 - 5 WBC/hpf   Bacteria, UA RARE (A) NONE SEEN   Squamous Epithelial / LPF 0-5 0 - 5   Mucus PRESENT    Hyaline Casts, UA PRESENT    Cellular Cast, UA PRESENT    Non Squamous Epithelial PRESENT (A) NONE SEEN    Comment: Performed at [facility], [address] [place], [place] [address]  CK     Status: Abnormal   Collection Time: 01/16/22 11:30 AM  Result Value Ref Range   Total CK 33 (L) 38 - 234 U/L    Comment: Performed at [facility], [address] [place], [place] [address]  Hepatic function panel     Status: Abnormal   Collection Time: 01/16/22 11:30 AM  Result Value Ref Range   Total Protein 7.1 6.5 - 8.1 g/dL   Albumin 3.2 (L) 3.5 - 5.0 g/dL   AST 27 15 - 41 U/L   ALT 52 (H) 0 - 44 U/L   Alkaline Phosphatase 98 38 - 126 U/L   Total Bilirubin 1.3 (H) 0.3 - 1.2 mg/dL   Bilirubin, Direct 0.3 (H) 0.0 - 0.2 mg/dL   Indirect  Bilirubin 1.0 (H) 0.3 - 0.9 mg/dL    Comment: Performed at [facility], [address] [place], [place] [address]  Lactic acid, plasma     Status: None   Collection Time: 01/16/22  1:58 PM  Result Value Ref Range   Lactic Acid, Venous 1.4 0.5 - 1.9 mmol/L    Comment: Performed at [facility], [address] [place], [place] [address]    DG Chest 2 View  Result Date: 01/16/2022 CLINICAL DATA:  Fever x5 days EXAM: CHEST - 2 VIEW COMPARISON:  None Available. FINDINGS: Cardiac size is within normal limits. Thoracic aorta is tortuous. There is poor inspiration. Linear patchy infiltrates are seen in both lower lung fields. There are no signs of alveolar pulmonary edema. There is possible minimal right pleural effusion. There is no pneumothorax. IMPRESSION: Linear patchy infiltrates are seen in the both lower lung fields suggesting atelectasis/pneumonia. Possible minimal right pleural effusion. Electronically Signed   By: Ernie Avena M.D.   On: 01/16/2022 15:14   US Venous Img Upper Uni Left  Result Date: 01/16/2022 CLINICAL DATA:  swelling left upper extremity with pain EXAM: Left UPPER EXTREMITY VENOUS DOPPLER ULTRASOUND TECHNIQUE: Gray-scale sonography with graded compression, as well as color Doppler and duplex ultrasound were performed to evaluate the upper extremity deep venous system from the level of the subclavian vein and including the jugular, axillary, basilic, radial, ulnar and upper cephalic vein. Spectral Doppler was utilized to evaluate flow at rest and with distal augmentation maneuvers. COMPARISON:  None Available. FINDINGS: Contralateral Subclavian Vein: Respiratory phasicity is normal and symmetric with the symptomatic side. No evidence of thrombus. Normal compressibility. Internal Jugular Vein: No evidence of thrombus. Normal compressibility, respiratory phasicity and response to augmentation. Subclavian Vein: No evidence of thrombus. Normal  compressibility, respiratory phasicity and response to augmentation. Axillary Vein: No evidence of thrombus. Normal compressibility, respiratory phasicity and response to augmentation. Cephalic Vein: No evidence of thrombus. Normal compressibility, respiratory phasicity and response to augmentation. Basilic Vein: No evidence of thrombus. Normal compressibility, respiratory phasicity and response to augmentation. Brachial Veins: No evidence of thrombus. Normal compressibility, respiratory phasicity and response to augmentation. Radial Veins: No evidence of thrombus. Normal compressibility, respiratory phasicity and response to augmentation. Ulnar Veins: No evidence of thrombus.  Normal compressibility, respiratory phasicity and response to augmentation. Venous Reflux:  None visualized. Other Findings:  None visualized. IMPRESSION: No evidence of DVT within the left upper extremity. Electronically Signed   By: Tish Frederickson M.D.   On: 01/16/2022 15:01    Review of Systems Blood pressure 94/62, pulse (!) 105, temperature 98.2 F (36.8 C), temperature source Oral, resp. rate 18, height 5\' 1"  (1.549 m), weight 68 kg, SpO2 94 %. Physical Exam She has range of motion of 30 to 90 degrees of flexion with 70 degrees of pronation supination with some pain throughout this motion.  She is neurovascular intact and skin is intact with a small area of erythema to the lateral posterior aspect of the elbow approximately 3 cm in diameter it is not particularly warm at this time and there is a sensation of fluctuance around the elbow. Assessment/Plan: Aspiration was attempted will be dictated separately and no fluid withdrawn I think it is unlikely that there would be a septic joint with multiple attempts to get fluid and suspect this is a synovitis related to what is ever going to be the underlying problem.  I will continue to follow her in the hospital.  01/16/2022, 4:05 PM

## 2022-01-16 NOTE — Assessment & Plan Note (Addendum)
Mild hyponatremia with sodium of 129, most likely secondary to poor p.o. intake as she appears dry. --improved with IVF.

## 2022-01-16 NOTE — H&P (Signed)
History and Physical    Patient: Molly Krueger OMA:[id number] DOB: 11/18/1960 DOA: 01/16/2022 DOS: the patient was seen and examined on 01/16/2022 PCP: Anne Ng, MD  Patient coming from: Home  Chief Complaint:  Chief Complaint  Patient presents with   Chills   HPI: Molly Krueger is a 61 y.o. female with no significant past medical history came to ED with complaint of fever, chills and left elbow pain.  Patient was in usual state of health until later last week, around the weekend she initially had minor cold-like symptoms which resolved, followed by fever and chills.  She went to urgent care and her COVID test was negative.  She was given Tylenol and was discharged.  Then she developed worsening left elbow pain which interferes with her ADLs.  She was also having mild urinary urgency and dark-colored urine. Patient denies any nausea or vomiting or diarrhea but having poor p.o. intake and appetite.  Denies any sick contacts.  Denies any abnormal bleeding. No history of recent travel, outdoor visits or hiking.  Denies any tick bite or abnormal rash. Per patient she always have lower blood pressure.  Her blood pressure normally stays between 14-23 systolic.  She denies any dizziness, chest pain or shortness of breath.  ED course.  On arrival she was febrile at 101 and tachycardic.  Blood pressure in 95V systolic.  Labs pertinent for thrombocytopenia with platelet of 60, no leukocytosis.  Mild hyponatremia with sodium of 129, bicarb of 20, creatinine of 1.21, ALT of 52 and T. bili of 1.3.  UA with leukocytosis Chest x-ray concerning for linear patchy infiltrates bilaterally suggesting atelectasis versus pneumonia.   Left elbow imaging with concern of effusion. CTA chest abdomen and pelvis was done and it shows 1. Moderate bilateral LOWER lobe opacities, question atelectasis and/or consolidation/pneumonia. Mild to moderate RIGHT middle lobe and lingular atelectasis and  trace bilateral pleural effusions. 2. Stranding/mildly enlarged lymph nodes in the periaortic region extending into the pelvic retroperitoneum regions with a small amount of fluid/stranding in the presacral/pre coccygeal region. These findings are nonspecific but may represent an infectious or inflammatory process. 3. Dependent high density within the gallbladder may represent sludge versus cholelithiasis. No CT evidence of acute cholecystitis. 4. Aortic Atherosclerosis (ICD10-I70.0).  Patient received broad-spectrum antibiotics and IV fluid per sepsis protocol and [facility] was consulted for admission for sepsis.  Blood and urine cultures were drawn.  General surgery was also consulted and they had multiple failed attempts and could not aspirate any fluid from left elbow.  Clinically elbow does not look infected, no erythema or edema.  Review of Systems: Negative except mentioned above  Social History:  reports that she has never smoked. She has never used smokeless tobacco. She reports that she does not drink alcohol and does not use drugs.  No Known Allergies  History reviewed. No pertinent family history.  Prior to Admission medications   Not on File    Physical Exam: Vitals:   01/16/22 1542 01/16/22 1715 01/16/22 1717 01/16/22 1901  BP: 94/62 (!) 88/61 (!) 97/57 (!) 106/56  Pulse: (!) 105 92 92 95  Resp: 18 16  17   Temp: 98.2 F (36.8 C) (!) 97.3 F (36.3 C)  98.4 F (36.9 C)  TempSrc: Oral Oral  Oral  SpO2: 94% 98% 100% 95%  Weight:      Height:        General: Vital signs reviewed.  Patient is well-developed and well-nourished, in no acute distress  and cooperative with exam.  Head: Normocephalic and atraumatic. Eyes: EOMI, conjunctivae normal, no scleral icterus.  Neck: Supple, trachea midline, normal ROM,  Cardiovascular: RRR, S1 normal, S2 normal, no murmurs, gallops, or rubs. Pulmonary/Chest: Clear to auscultation bilaterally, no wheezes, rales, or  rhonchi. Abdominal: Soft, non-tender, non-distended, BS +,  Musculoskeletal: No joint deformities, erythema, or edema, mildly restricted range of motion at left elbow. Extremities: No lower extremity edema bilaterally,  pulses symmetric and intact bilaterally. No cyanosis or clubbing. Neurological: A&O x3, Strength is normal and symmetric bilaterally, cranial nerve II-XII are grossly intact, no focal motor deficit, sensory intact to light touch bilaterally.  Skin: Warm, dry and intact. No rashes or erythema. Psychiatric: Normal mood and affect.   Data Reviewed: Prior data reviewed.  Assessment and Plan: * Sepsis [facility]) Patient met sepsis criteria with fever and tachycardia.  Potential source of infection either UTI or viral illness. Received IV fluid and broad-spectrum antibiotics per sepsis protocol in ED. Blood and urine cultures were drawn and pending. Chest imaging with some concern of inflammatory changes versus pneumonia. CBC with thrombocytopenia, only 1 prior CBC in the system which was done in 2018 and platelets were 95. Mild urinary symptoms of urinary urgency, UA with leukocytosis. Left elbow pain without any erythema or edema, DG left elbow with some concern of joint effusion, surgery was consulted and they had multiple failed attempts to drain any fluid. Blood pressure soft-per patient she always have softer blood pressure.  Her normal is between 50-35 systolic. -Admit to MedSurg -Follow-up urine and blood cultures -Start her on ceftriaxone and Zithromax -Check procalcitonin-we can discontinue antibiotics if negative. -Check parvo B19 virus -Giving some more IV fluid as she appears dry.  AKI (acute kidney injury) ([place]) Most likely secondary to poor p.o. intake and dehydration.  Creatinine at 1.21, no recent baseline. -Giving some IV fluid -Monitor renal function  Hyponatremia Mild hyponatremia with sodium of 129, most likely secondary to poor p.o. intake as she appears  dry. Giving some normal saline. -Monitor sodium  Advance Care Planning:   Code Status: Full Code   Consults: None  Family Communication: Discussed with patient  Severity of Illness: The appropriate patient status for this patient is INPATIENT. Inpatient status is judged to be reasonable and necessary in order to provide the required intensity of service to ensure the patient's safety. The patient's presenting symptoms, physical exam findings, and initial radiographic and laboratory data in the context of their chronic comorbidities is felt to place them at high risk for further clinical deterioration. Furthermore, it is not anticipated that the patient will be medically stable for discharge from the hospital within 2 midnights of admission.   This record has been created using Systems analyst. Errors have been sought and corrected,but may not always be located. Such creation errors do not reflect on the standard of care.   * I certify that at the point of admission it is my clinical judgment that the patient will require inpatient hospital care spanning beyond 2 midnights from the point of admission due to high intensity of service, high risk for further deterioration and high frequency of surveillance required.*  Author: Lorella Nimrod, MD 01/16/2022 7:12 PM  For on call review [email or web address].

## 2022-01-16 NOTE — Consult Note (Signed)
CODE SEPSIS - PHARMACY COMMUNICATION  **Broad Spectrum Antibiotics should be administered within 1 hour of Sepsis diagnosis**  Time Code Sepsis Called/Page Received: 1348  Antibiotics Ordered: Cefepime, Vancomycin, Flagyl  Time of 1st antibiotic administration: 1422  Additional action taken by pharmacy: none  If necessary, Name of Provider/Nurse Contacted: n/a    Bettey Costa ,PharmD Clinical Pharmacist  01/16/2022  2:26 PM

## 2022-01-16 NOTE — Assessment & Plan Note (Addendum)
Patient met sepsis criteria with fever and tachycardia.  Source bacteremia and septic joint Received IV fluid and broad-spectrum antibiotics per sepsis protocol in ED.

## 2022-01-16 NOTE — ED Triage Notes (Addendum)
Pt here via [facility] with chills and body aches. Pt states she is dehydrated. Pt went to UC yesterday, covid neg. Pt also having back pain and left arm cramping. Pt stated this happened before when she was training for a marathon. Pt states she had a 101 temp yesterday and took tylenol. Pt states she is very weak.   98% RA 106/68 112

## 2022-01-16 NOTE — Plan of Care (Signed)
  Problem: Fluid Volume: Goal: Hemodynamic stability will improve Outcome: Not Progressing   Problem: Clinical Measurements: Goal: Signs and symptoms of infection will decrease Outcome: Not Progressing  Patient profile completed. Elevated temperature, yellow MEWS. Tylenol was given in ED. Skin intact, some bruising. Patient is in agreement with plan of care.

## 2022-01-16 NOTE — Op Note (Signed)
*   No surgery found *  4:07 PM  PATIENT:  Molly Krueger  61 y.o. female  PRE-OPERATIVE DIAGNOSIS:   Possible septic elbow  POST-OPERATIVE DIAGNOSIS: Same  PROCEDURE: Left elbow aspiration  SURGEON: Leitha Schuller, MD  ANESTHESIA:   none  EBL:  Total I/O In: 2370 [IV Piggyback:2370] Out: -   BLOOD ADMINISTERED:none  DRAINS: none   LOCAL MEDICATIONS USED:  NONE  SPECIMEN:  No Specimen  DISPOSITION OF SPECIMEN:  N/A  COUNTS:  NO no count with simple aspiration  TOURNIQUET:  * No surgery found *  IMPLANTS: None  DICTATION: .Dragon Dictation after informed consent had been obtained the lateral aspect of the elbow was prepped first with alcohol swabs and Betadine swabs.  An 18-gauge needle was then inserted into the posterior lateral aspect of the elbow where a sensation of fluid was noted adjacent to the radial head.  This was placed down to the bone and then multiple aspiration attempts were made without any fluid withdrawn.  This felt with this gauge needle in this area if there was infection there would have been some evidence of that with aspiration so the needles was withdrawn without any fluid withdrawn and a Band-Aid applied.  PLAN OF CARE:  Continued work-up possible admission for sepsis  PATIENT DISPOSITION:   Patient status unchanged post aspiration now being transferred to CT for abdominal CT.

## 2022-01-16 NOTE — ED Provider Notes (Signed)
[facility] Provider Note    Event Date/Time   First MD Initiated Contact with Patient 01/16/22 1224     (approximate)   History   Chills   HPI  Ngina Royer is a 61 y.o. female who comes in with left elbow pain.  Patient reports that she was seen at urgent care yesterday had a COVID, flu test that was negative and they gave her Tylenol and she was discharged.  She had a fever of 101.  She reports having increasing fevers and took ibuprofen before coming in.  She reports having some pain and swelling to the left elbow as well as some discoloration of her urine.  She reports a little bit of right flank pain.  Physical Exam   Triage Vital Signs: ED Triage Vitals  Enc Vitals Group     BP 01/16/22 1125 100/72     Pulse Rate 01/16/22 1125 (!) 116     Resp 01/16/22 1125 16     Temp 01/16/22 1125 98.2 F (36.8 C)     Temp Source 01/16/22 1125 Oral     SpO2 01/16/22 1125 94 %     Weight 01/16/22 1126 150 lb (68 kg)     Height 01/16/22 1126 5\' 1"  (1.549 m)     Head Circumference --      Peak Flow --      Pain Score 01/16/22 1126 7     Pain Loc --      Pain Edu? --      Excl. in GC? --     Most recent vital signs: Vitals:   01/16/22 1125 01/16/22 1227  BP: 100/72 100/64  Pulse: (!) 116 (!) 102  Resp: 16 17  Temp: 98.2 F (36.8 C) 98 F (36.7 C)  SpO2: 94% 97%     General: Awake, no distress.  CV:  Good peripheral perfusion.  Resp:  Normal effort.  Abd:  No distention.  Soft and nontender Other:  Patient has a little bit of redness on her left elbow with some decreased range of motion secondary to pain.  Has not situated over the bursa.  She is a little bit of right CVA tenderness.  She got some good distal pulse   ED Results / Procedures / Treatments   Labs (all labs ordered are listed, but only abnormal results are displayed) Labs Reviewed  BASIC METABOLIC PANEL - Abnormal; Notable for the following components:      Result  Value   Sodium 129 (*)    CO2 20 (*)    Glucose, Bld 149 (*)    BUN 35 (*)    Creatinine, Ser 1.21 (*)    Calcium 8.6 (*)    GFR, Estimated 51 (*)    All other components within normal limits  CBC - Abnormal; Notable for the following components:   RBC 3.68 (*)    HCT 34.3 (*)    Platelets 60 (*)    All other components within normal limits  URINALYSIS, ROUTINE W REFLEX MICROSCOPIC - Abnormal; Notable for the following components:   Color, Urine YELLOW (*)    APPearance CLOUDY (*)    Hgb urine dipstick MODERATE (*)    Ketones, ur 5 (*)    Protein, ur 100 (*)    Leukocytes,Ua LARGE (*)    WBC, UA >50 (*)    Bacteria, UA RARE (*)    Non Squamous Epithelial PRESENT (*)    All other components within  normal limits  CK - Abnormal; Notable for the following components:   Total CK 33 (*)    All other components within normal limits  HEPATIC FUNCTION PANEL - Abnormal; Notable for the following components:   Albumin 3.2 (*)    ALT 52 (*)    Total Bilirubin 1.3 (*)    Bilirubin, Direct 0.3 (*)    Indirect Bilirubin 1.0 (*)    All other components within normal limits  URINE CULTURE  CULTURE, BLOOD (ROUTINE X 2)  CULTURE, BLOOD (ROUTINE X 2)  LACTIC ACID, PLASMA  LACTIC ACID, PLASMA  CBG MONITORING, ED     EKG  My interpretation of EKG:   Sinus tachycardia 116 without any ST elevation, T wave versions are normal intervals  RADIOLOGY I have reviewed the xray personally and interpreted and see possible atelectasis versus pneumonia in the lower lung fields    PROCEDURES:  Critical Care performed: Yes, see critical care procedure note(s)  .1-3 Lead EKG Interpretation  Performed by: Concha Se, MD Authorized by: Concha Se, MD     Interpretation: abnormal     ECG rate:  100   ECG rate assessment: tachycardic     Rhythm: sinus tachycardia     Ectopy: none     Conduction: normal   .Critical Care  Performed by: Concha Se, MD Authorized by: Concha Se, MD   Critical care provider statement:    Critical care time (minutes):  30   Critical care was necessary to treat or prevent imminent or life-threatening deterioration of the following conditions:  Sepsis   Critical care was time spent personally by me on the following activities:  Development of treatment plan with patient or surrogate, discussions with consultants, evaluation of patient's response to treatment, examination of patient, ordering and review of laboratory studies, ordering and review of radiographic studies, ordering and performing treatments and interventions, pulse oximetry, re-evaluation of patient's condition and review of old charts    MEDICATIONS ORDERED IN ED: Medications  sodium chloride 0.9 % bolus 1,000 mL (0 mLs Intravenous Stopped 01/16/22 1416)  acetaminophen (TYLENOL) tablet 1,000 mg (1,000 mg Oral Given 01/16/22 1335)  sodium chloride 0.9 % bolus 1,000 mL (1,000 mLs Intravenous New Bag/Given 01/16/22 1416)  ceFEPIme (MAXIPIME) 2 g in sodium chloride 0.9 % 100 mL IVPB (2 g Intravenous New Bag/Given 01/16/22 1422)  metroNIDAZOLE (FLAGYL) IVPB 500 mg (500 mg Intravenous New Bag/Given 01/16/22 1424)  vancomycin (VANCOCIN) IVPB 1000 mg/200 mL premix (1,000 mg Intravenous New Bag/Given 01/16/22 1426)     IMPRESSION / MDM / ASSESSMENT AND PLAN / ED COURSE  I reviewed the triage vital signs and the nursing notes.   Patient's presentation is most consistent with acute presentation with potential threat to life or bodily function.   She comes in with fevers, COVID flu negative yesterday.  I am concerned about possibility of sepsis, cellulitis, septic joint, UTI, kidney stone.  She does have symptoms of a UTI but also this left elbow is also concerning.  We will get some x-rays and urine and ultrasound to further evaluate.  On recheck her temperature was 100.5 so sepsis alert was called and patient was started on broad-spectrum antibiotics and given some fluid.  Lactate  is normal.  CMP shows some dehydration with some kidney function elevation.  CBC shows normal white count.  Urine looks concerning for UTI will send for culture but patient does have some blood noted in the urine.  X-ray was  concerning for possible pneumonia and she does report having some cough and congestion and that is how it all started.  Is difficult to know if her fevers and infection are from pneumonia from an infected kidney stone from a UTI or from the elbow.  Will get CT without contrast to evaluate for kidney stone, pneumonia and then will consult Ortho for evaluation of the elbow.  Discussed with Dr. Rosita Kea he will come down to see patient.  Patient handed off to oncoming team pending CT scans and suspect admission.  Patient's heart rate has come down with the fluids    The patient is on the cardiac monitor to evaluate for evidence of arrhythmia and/or significant heart rate changes.      FINAL CLINICAL IMPRESSION(S) / ED DIAGNOSES   Final diagnoses:  Sepsis, due to unspecified organism, unspecified whether acute organ dysfunction present [facility]     Rx / DC Orders   ED Discharge Orders     None        Note:  This document was prepared using Dragon voice recognition software and may include unintentional dictation errors.   Concha Se, MD 01/16/22 [phone]

## 2022-01-16 NOTE — Consult Note (Signed)
PHARMACY -  BRIEF ANTIBIOTIC NOTE   Pharmacy has received consult(s) for Vancomycin and Cefepime from an ED provider.  The patient's profile has been reviewed for ht/wt/allergies/indication/available labs.    One time order(s) placed for Vancomycin 1g IV and Cefepime 2g IV x 1 dose each.  Further antibiotics/pharmacy consults should be ordered by admitting physician if indicated.                       Thank you, Bettey Costa 01/16/2022  1:52 PM

## 2022-01-16 NOTE — Sepsis Progress Note (Signed)
eLink is following this Code Sepsis. °

## 2022-01-16 NOTE — ED Notes (Signed)
ED TO INPATIENT HANDOFF REPORT  ED Nurse Name and Phone #: [phone] Clines Name/Age/Gender Molly Krueger 61 y.o. female Room/Bed: ED07A/ED07A  Code Status   Code Status: Full Code  Home/SNF/Other Home Patient oriented to: self, place, and time Is this baseline? Yes   Triage Complete: Triage complete  Chief Complaint Sepsis [facility]) [A41.9]  Triage Note Pt here via [facility] with chills and body aches. Pt states she is dehydrated. Pt went to UC yesterday, covid neg. Pt also having back pain and left arm cramping. Pt stated this happened before when she was training for a marathon. Pt states she had a 101 temp yesterday and took tylenol. Pt states she is very weak.   98% RA 106/68 112   Allergies No Known Allergies  Level of Care/Admitting Diagnosis ED Disposition     ED Disposition  Admit   Condition  --   Comment  Hospital Area: [facility] [[id number]]  Level of Care: Med-Surg [16]  Covid Evaluation: Confirmed COVID Negative  Diagnosis: Sepsis [facility]) [[id number]]  Admitting Physician: Arnetha Courser [[id number]]  Attending Physician: Arnetha Courser [[id number]]  Estimated length of stay: past midnight tomorrow  Certification:: I certify this patient will need inpatient services for at least 2 midnights          B Medical/Surgery History History reviewed. No pertinent past medical history. Past Surgical History:  Procedure Laterality Date   KNEE SURGERY Left    KNEE SURGERY  2020   2020-right,     A IV Location/Drains/Wounds Patient Lines/Drains/Airways Status     Active Line/Drains/Airways     Name Placement date Placement time Site Days   Peripheral IV 01/16/22 20 G Anterior;Proximal;Right Forearm 01/16/22  1234  Forearm  less than 1   Peripheral IV 01/16/22 22 G Anterior;Right Wrist 01/16/22  1350  Wrist  less than 1            Intake/Output Last 24 hours  Intake/Output Summary (Last 24 hours) at 01/16/2022 1915 Last data  filed at 01/16/2022 1542 Gross per 24 hour  Intake 2369.96 ml  Output --  Net 2369.96 ml    Labs/Imaging Results for orders placed or performed during the hospital encounter of 01/16/22 (from the past 48 hour(s))  Basic metabolic panel     Status: Abnormal   Collection Time: 01/16/22 11:30 AM  Result Value Ref Range   Sodium 129 (L) 135 - 145 mmol/L   Potassium 3.7 3.5 - 5.1 mmol/L   Chloride 98 98 - 111 mmol/L   CO2 20 (L) 22 - 32 mmol/L   Glucose, Bld 149 (H) 70 - 99 mg/dL    Comment: Glucose reference range applies only to samples taken after fasting for at least 8 hours.   BUN 35 (H) 8 - 23 mg/dL   Creatinine, Ser 7.12 (H) 0.44 - 1.00 mg/dL   Calcium 8.6 (L) 8.9 - 10.3 mg/dL   GFR, Estimated 51 (L) >60 mL/min    Comment: (NOTE) Calculated using the CKD-EPI Creatinine Equation (2021)    Anion gap 11 5 - 15    Comment: Performed at [facility], [address] [place], [place] [address]  CBC     Status: Abnormal   Collection Time: 01/16/22 11:30 AM  Result Value Ref Range   WBC 9.8 4.0 - 10.5 K/uL   RBC 3.68 (L) 3.87 - 5.11 MIL/uL   Hemoglobin 12.2 12.0 - 15.0 g/dL   HCT 98.3 (L) 38.2 - 50.5 %  MCV 93.2 80.0 - 100.0 fL   MCH 33.2 26.0 - 34.0 pg   MCHC 35.6 30.0 - 36.0 g/dL   RDW 10.1 75.1 - 02.5 %   Platelets 60 (L) 150 - 400 K/uL    Comment: Immature Platelet Fraction may be clinically indicated, consider ordering this additional test [id number] PLATELET COUNT CONFIRMED BY SMEAR    nRBC 0.0 0.0 - 0.2 %    Comment: Performed at [facility], [address] [place], [place] [address]  Urinalysis, Routine w reflex microscopic     Status: Abnormal   Collection Time: 01/16/22 11:30 AM  Result Value Ref Range   Color, Urine YELLOW (A) YELLOW   APPearance CLOUDY (A) CLEAR   Specific Gravity, Urine 1.019 1.005 - 1.030   pH 5.0 5.0 - 8.0   Glucose, UA NEGATIVE NEGATIVE mg/dL   Hgb urine dipstick MODERATE (A) NEGATIVE   Bilirubin Urine NEGATIVE  NEGATIVE   Ketones, ur 5 (A) NEGATIVE mg/dL   Protein, ur 144 (A) NEGATIVE mg/dL   Nitrite NEGATIVE NEGATIVE   Leukocytes,Ua LARGE (A) NEGATIVE   RBC / HPF 6-10 0 - 5 RBC/hpf   WBC, UA >50 (H) 0 - 5 WBC/hpf   Bacteria, UA RARE (A) NONE SEEN   Squamous Epithelial / LPF 0-5 0 - 5   Mucus PRESENT    Hyaline Casts, UA PRESENT    Cellular Cast, UA PRESENT    Non Squamous Epithelial PRESENT (A) NONE SEEN    Comment: Performed at [facility], [address] [place], [place] [address]  CK     Status: Abnormal   Collection Time: 01/16/22 11:30 AM  Result Value Ref Range   Total CK 33 (L) 38 - 234 U/L    Comment: Performed at [facility], [address] [place], [place] [address]  Hepatic function panel     Status: Abnormal   Collection Time: 01/16/22 11:30 AM  Result Value Ref Range   Total Protein 7.1 6.5 - 8.1 g/dL   Albumin 3.2 (L) 3.5 - 5.0 g/dL   AST 27 15 - 41 U/L   ALT 52 (H) 0 - 44 U/L   Alkaline Phosphatase 98 38 - 126 U/L   Total Bilirubin 1.3 (H) 0.3 - 1.2 mg/dL   Bilirubin, Direct 0.3 (H) 0.0 - 0.2 mg/dL   Indirect Bilirubin 1.0 (H) 0.3 - 0.9 mg/dL    Comment: Performed at [facility], [address] [place], [place] [address]  Lactic acid, plasma     Status: None   Collection Time: 01/16/22  1:58 PM  Result Value Ref Range   Lactic Acid, Venous 1.4 0.5 - 1.9 mmol/L    Comment: Performed at [facility], [address] [place], [place] [address]  Lactic acid, plasma     Status: None   Collection Time: 01/16/22  3:51 PM  Result Value Ref Range   Lactic Acid, Venous 1.2 0.5 - 1.9 mmol/L    Comment: Performed at [facility], [address] [place], [place] [address]   CT CHEST ABDOMEN PELVIS WO CONTRAST  Result Date: 01/16/2022 CLINICAL DATA:  61 year old female with acute chest, abdominal and pelvic pain with chills and body aches. EXAM: CT CHEST, ABDOMEN AND PELVIS WITHOUT CONTRAST TECHNIQUE: Multidetector CT  imaging of the chest, abdomen and pelvis was performed following the standard protocol without IV contrast. RADIATION DOSE REDUCTION: This exam was performed according to the departmental dose-optimization program which includes automated exposure  control, adjustment of the mA and/or kV according to patient size and/or use of iterative reconstruction technique. COMPARISON:  None Available. FINDINGS: Please note that parenchymal and vascular abnormalities may be missed as intravenous contrast was not administered. CT CHEST FINDINGS Cardiovascular: No significant vascular findings. Normal heart size. No pericardial effusion. Mediastinum/Nodes: No enlarged mediastinal, hilar, or axillary lymph nodes. Thyroid gland, trachea, and esophagus demonstrate no significant findings. Lungs/Pleura: Moderate bilateral LOWER lobe opacities cast question atelectasis and/or consolidation/pneumonia. Mild to moderate RIGHT middle lobe and lingular atelectasis noted. Trace bilateral pleural effusions are present. There is no evidence of pneumothorax. Musculoskeletal: No acute or suspicious bony abnormalities are noted. CT ABDOMEN PELVIS FINDINGS Hepatobiliary: The liver is unremarkable. Dependent high density within the gallbladder may represent sludge versus cholelithiasis. No CT evidence of acute cholecystitis identified. There is no evidence of intrahepatic or extrahepatic biliary dilatation. Pancreas: Unremarkable Spleen: UPPER limits normal spleen size noted. Adrenals/Urinary Tract: The kidneys, adrenal glands and bladder are unremarkable except for a probable LEFT renal cyst which need no follow-up. Stomach/Bowel: Stomach is within normal limits. No evidence of bowel wall thickening, distention, or inflammatory changes. Vascular/Lymphatic: Aortic atherosclerosis. No enlarged abdominal or pelvic lymph nodes. Reproductive: Uterus and bilateral adnexa are unremarkable. Other: Stranding/mildly enlarged lymph nodes in the periaortic  region identified with an index aortocaval node measuring 1.1 cm (series 2: Image 61). This stranding extends down bilateral posterior retroperitoneal regions and there is a small amount of fluid/stranding in the presacral/pre coccygeal region. There is no evidence of focal collection pneumoperitoneum. Musculoskeletal: No acute or suspicious bony abnormalities are noted. IMPRESSION: 1. Moderate bilateral LOWER lobe opacities, question atelectasis and/or consolidation/pneumonia. Mild to moderate RIGHT middle lobe and lingular atelectasis and trace bilateral pleural effusions. 2. Stranding/mildly enlarged lymph nodes in the periaortic region extending into the pelvic retroperitoneum regions with a small amount of fluid/stranding in the presacral/pre coccygeal region. These findings are nonspecific but may represent an infectious or inflammatory process. 3. Dependent high density within the gallbladder may represent sludge versus cholelithiasis. No CT evidence of acute cholecystitis. 4. Aortic Atherosclerosis (ICD10-I70.0). Electronically Signed   By: Harmon Pier M.D.   On: 01/16/2022 16:32   DG Elbow Complete Left  Result Date: 01/16/2022 CLINICAL DATA:  Arm pain EXAM: LEFT ELBOW - COMPLETE 4 VIEW COMPARISON:  None Available. FINDINGS: No acute fracture or dislocation. Elbow joint effusion. There is no evidence of arthropathy or other focal bone abnormality. Soft tissues are unremarkable. IMPRESSION: 1. Elbow joint effusion, concerning for infectious or inflammatory etiology given no history of trauma. 2. No acute osseous abnormality. Electronically Signed   By: Allegra Lai M.D.   On: 01/16/2022 15:17   DG Chest 2 View  Result Date: 01/16/2022 CLINICAL DATA:  Fever x5 days EXAM: CHEST - 2 VIEW COMPARISON:  None Available. FINDINGS: Cardiac size is within normal limits. Thoracic aorta is tortuous. There is poor inspiration. Linear patchy infiltrates are seen in both lower lung fields. There are no signs of  alveolar pulmonary edema. There is possible minimal right pleural effusion. There is no pneumothorax. IMPRESSION: Linear patchy infiltrates are seen in the both lower lung fields suggesting atelectasis/pneumonia. Possible minimal right pleural effusion. Electronically Signed   By: Ernie Avena M.D.   On: 01/16/2022 15:14   US Venous Img Upper Uni Left  Result Date: 01/16/2022 CLINICAL DATA:  swelling left upper extremity with pain EXAM: Left UPPER EXTREMITY VENOUS DOPPLER ULTRASOUND TECHNIQUE: Gray-scale sonography with graded compression, as well as color Doppler and  duplex ultrasound were performed to evaluate the upper extremity deep venous system from the level of the subclavian vein and including the jugular, axillary, basilic, radial, ulnar and upper cephalic vein. Spectral Doppler was utilized to evaluate flow at rest and with distal augmentation maneuvers. COMPARISON:  None Available. FINDINGS: Contralateral Subclavian Vein: Respiratory phasicity is normal and symmetric with the symptomatic side. No evidence of thrombus. Normal compressibility. Internal Jugular Vein: No evidence of thrombus. Normal compressibility, respiratory phasicity and response to augmentation. Subclavian Vein: No evidence of thrombus. Normal compressibility, respiratory phasicity and response to augmentation. Axillary Vein: No evidence of thrombus. Normal compressibility, respiratory phasicity and response to augmentation. Cephalic Vein: No evidence of thrombus. Normal compressibility, respiratory phasicity and response to augmentation. Basilic Vein: No evidence of thrombus. Normal compressibility, respiratory phasicity and response to augmentation. Brachial Veins: No evidence of thrombus. Normal compressibility, respiratory phasicity and response to augmentation. Radial Veins: No evidence of thrombus. Normal compressibility, respiratory phasicity and response to augmentation. Ulnar Veins: No evidence of thrombus. Normal  compressibility, respiratory phasicity and response to augmentation. Venous Reflux:  None visualized. Other Findings:  None visualized. IMPRESSION: No evidence of DVT within the left upper extremity. Electronically Signed   By: Tish Frederickson M.D.   On: 01/16/2022 15:01    Pending Labs Unresulted Labs (From admission, onward)     Start     Ordered   01/17/22 0500  Procalcitonin  Daily,   URGENT      01/16/22 1817   01/17/22 0500  Basic metabolic panel  Tomorrow morning,   STAT        01/16/22 1830   01/17/22 0500  CBC  Tomorrow morning,   STAT        01/16/22 1830   01/16/22 1831  Human parvovirus DNA detection by PCR  Once,   URGENT        01/16/22 1830   01/16/22 1831  Parvovirus B19 antibody, IgG and IgM  Once,   URGENT        01/16/22 1830   01/16/22 1826  HIV Antibody (routine testing w rflx)  (HIV Antibody (Routine testing w reflex) panel)  Once,   URGENT        01/16/22 1830   01/16/22 1818  Procalcitonin - Baseline  ONCE - URGENT,   URGENT        01/16/22 1817   01/16/22 1328  Blood culture (routine x 2)  BLOOD CULTURE X 2,   STAT      01/16/22 1327   01/16/22 1225  Urine Culture  Add-on,   AD       Question:  Indication  Answer:  Dysuria   01/16/22 1224            Vitals/Pain Today's Vitals   01/16/22 1715 01/16/22 1715 01/16/22 1717 01/16/22 1901  BP:  (!) 88/61 (!) 97/57 (!) 106/56  Pulse:  92 92 95  Resp:  16  17  Temp:  (!) 97.3 F (36.3 C)  98.4 F (36.9 C)  TempSrc:  Oral  Oral  SpO2:  98% 100% 95%  Weight:      Height:      PainSc: 5  5   6      Isolation Precautions No active isolations  Medications Medications  sodium chloride flush (NS) 0.9 % injection 3 mL (has no administration in time range)  acetaminophen (TYLENOL) tablet 650 mg (has no administration in time range)    Or  acetaminophen (TYLENOL) suppository 650 mg (  has no administration in time range)  polyethylene glycol (MIRALAX / GLYCOLAX) packet 17 g (has no administration in time  range)  ondansetron (ZOFRAN) tablet 4 mg (has no administration in time range)    Or  ondansetron (ZOFRAN) injection 4 mg (has no administration in time range)  cefTRIAXone (ROCEPHIN) 2 g in sodium chloride 0.9 % 100 mL IVPB (has no administration in time range)  0.9 %  sodium chloride infusion (has no administration in time range)  sodium chloride 0.9 % bolus 1,000 mL (0 mLs Intravenous Stopped 01/16/22 1416)  acetaminophen (TYLENOL) tablet 1,000 mg (1,000 mg Oral Given 01/16/22 1335)  sodium chloride 0.9 % bolus 1,000 mL (0 mLs Intravenous Stopped 01/16/22 1541)  ceFEPIme (MAXIPIME) 2 g in sodium chloride 0.9 % 100 mL IVPB (0 g Intravenous Stopped 01/16/22 1452)  metroNIDAZOLE (FLAGYL) IVPB 500 mg (0 mg Intravenous Stopped 01/16/22 1541)  vancomycin (VANCOCIN) IVPB 1000 mg/200 mL premix (0 mg Intravenous Stopped 01/16/22 1542)  oxyCODONE (Oxy IR/ROXICODONE) immediate release tablet 5 mg (5 mg Oral Given 01/16/22 1549)  sodium chloride 0.9 % bolus 1,000 mL (1,000 mLs Intravenous New Bag/Given 01/16/22 1909)    Mobility walks Low fall risk      R Recommendations: See Admitting Provider Note  Report given to:   Additional Notes: ambulatory without assistance

## 2022-01-16 NOTE — Assessment & Plan Note (Addendum)
Most likely secondary to poor p.o. intake and dehydration.  Creatinine at 1.21 on presentation.  Improved to 0.61 the next day.

## 2022-01-17 DIAGNOSIS — R509 Fever, unspecified: Secondary | ICD-10-CM

## 2022-01-17 DIAGNOSIS — E876 Hypokalemia: Secondary | ICD-10-CM

## 2022-01-17 DIAGNOSIS — M25422 Effusion, left elbow: Secondary | ICD-10-CM

## 2022-01-17 DIAGNOSIS — A419 Sepsis, unspecified organism: Secondary | ICD-10-CM | POA: Diagnosis not present

## 2022-01-17 DIAGNOSIS — N179 Acute kidney failure, unspecified: Secondary | ICD-10-CM | POA: Diagnosis not present

## 2022-01-17 DIAGNOSIS — B955 Unspecified streptococcus as the cause of diseases classified elsewhere: Secondary | ICD-10-CM

## 2022-01-17 DIAGNOSIS — M009 Pyogenic arthritis, unspecified: Secondary | ICD-10-CM | POA: Insufficient documentation

## 2022-01-17 DIAGNOSIS — R6520 Severe sepsis without septic shock: Secondary | ICD-10-CM | POA: Diagnosis not present

## 2022-01-17 DIAGNOSIS — D696 Thrombocytopenia, unspecified: Secondary | ICD-10-CM

## 2022-01-17 LAB — BLOOD CULTURE ID PANEL (REFLEXED) - BCID2

## 2022-01-17 LAB — CBC
HCT: 26.7 % — ABNORMAL LOW (ref 36.0–46.0)
Hemoglobin: 9.2 g/dL — ABNORMAL LOW (ref 12.0–15.0)
MCH: 32.7 pg (ref 26.0–34.0)
MCHC: 34.5 g/dL (ref 30.0–36.0)
MCV: 95 fL (ref 80.0–100.0)
Platelets: 50 10*3/uL — ABNORMAL LOW (ref 150–400)
RBC: 2.81 MIL/uL — ABNORMAL LOW (ref 3.87–5.11)
RDW: 13.7 % (ref 11.5–15.5)
WBC: 6.8 10*3/uL (ref 4.0–10.5)
nRBC: 0 % (ref 0.0–0.2)

## 2022-01-17 LAB — BASIC METABOLIC PANEL
Anion gap: 7 (ref 5–15)
BUN: 14 mg/dL (ref 8–23)
CO2: 19 mmol/L — ABNORMAL LOW (ref 22–32)
Calcium: 7.7 mg/dL — ABNORMAL LOW (ref 8.9–10.3)
Chloride: 108 mmol/L (ref 98–111)
Creatinine, Ser: 0.61 mg/dL (ref 0.44–1.00)
GFR, Estimated: >60 mL/min (ref >60)
Glucose, Bld: 137 mg/dL — ABNORMAL HIGH (ref 70–99)
Potassium: 2.8 mmol/L — ABNORMAL LOW (ref 3.5–5.1)
Sodium: 134 mmol/L — ABNORMAL LOW (ref 135–145)

## 2022-01-17 LAB — PROCALCITONIN: Procalcitonin: 2.89 ng/mL

## 2022-01-17 LAB — GLUCOSE, CAPILLARY: Glucose-Capillary: 96 mg/dL (ref 70–99)

## 2022-01-17 MED ORDER — LORAZEPAM 2 MG/ML IJ SOLN
1.0000 mg | Freq: Once | INTRAMUSCULAR | Status: AC
  Administered 2022-01-18: 1 mg via INTRAVENOUS
  Filled 2022-01-17: qty 1

## 2022-01-17 MED ORDER — BLISTEX MEDICATED EX OINT
1.0000 | TOPICAL_OINTMENT | CUTANEOUS | Status: DC | PRN
  Filled 2022-01-17: qty 6.3

## 2022-01-17 MED ORDER — CYCLOBENZAPRINE HCL 10 MG PO TABS
10.0000 mg | ORAL_TABLET | Freq: Three times a day (TID) | ORAL | Status: DC | PRN
  Administered 2022-01-17 – 2022-01-20 (×3): 10 mg via ORAL
  Filled 2022-01-17 (×3): qty 1

## 2022-01-17 MED ORDER — LORAZEPAM 2 MG/ML IJ SOLN: 1.0000 mg | Freq: Once | INTRAMUSCULAR | Status: DC | PRN

## 2022-01-17 MED ORDER — POTASSIUM CHLORIDE CRYS ER 20 MEQ PO TBCR
40.0000 meq | EXTENDED_RELEASE_TABLET | ORAL | Status: AC
  Administered 2022-01-17 (×2): 40 meq via ORAL
  Filled 2022-01-17 (×2): qty 2

## 2022-01-17 MED ORDER — NAPROXEN 500 MG PO TABS
500.0000 mg | ORAL_TABLET | Freq: Two times a day (BID) | ORAL | Status: DC | PRN
  Administered 2022-01-19 – 2022-01-21 (×2): 500 mg via ORAL
  Filled 2022-01-17 (×4): qty 1

## 2022-01-17 NOTE — Consult Note (Incomplete)
NAME: Molly Krueger  DOB: [birth year]  MRN: [id number]  Date/Time: 01/17/2022 1:59 PM  REQUESTING PROVIDER: Dr.Lai Subjective:  REASON FOR CONSULT: bacteremia   Molly Krueger is a 61 y.o. female with history of thrombocytopenia and aplastic anemia according to her presents with sob, fever, left arm pain , lethargy  [facility] temp 101, BP 93/69, Pulse 108 and sats 95%. Thought to have viral fever . Asked to take NSAID. She came to the ED  on 01/16/22 with cramping left arm pain and worsening fever. Vitals in the ED 100/72, pulse 102, temp 98. Na 129, cr 1.21, glucose 149, ALT 52,ast 27, Tbili 1.3, wbc 9.8, HB 12.2, plt 60 Xray elbow showed effusion CXR linear streaks lower lobe suggestive of atelectasis Dr. Rudene Christians try to aspirate the left elbow but could not As it was a dry tap As per patient she was doing fine till last Friday On Saturday she was feeling Fatigued and lay in bed and that evening she had chills with rigors.  She took Motrin and Tylenol On Sunday she was at home predominantly That evening again had chills but never checked her temperature.  She then developed pain in the upper back as well as the lower back and also the left arm especially around the elbow.  She thought initially it was due to the way she was laying down in bed Patient has no recent travel history No pets at home No tick bites or animal bites She has history of aplastic anemia and thrombocytopenia since childhood She had received in the past multiple blood transfusion and platelet transfusion The last was in the 90s when she had given birth to her third daughter. 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  Has not been comfortable in many many years now But does run 2 to 3 miles for fun over the weekends  I am seeing the patient as the blood culture is positive for Streptococcus and a gram-positive rod  Past medical  history Thrombocytopenia Aplastic anemia Blood transfusions and platelet transfusion    Past Surgical History:  Procedure Laterality Date  . KNEE SURGERY Left   . KNEE SURGERY  2020   2020-right,    Social History   Socioeconomic History  . Marital status: Married    Spouse name: Not on file  . Number of children: Not on file  . Years of education: Not on file  . Highest education level: Not on file  Occupational History  . Not on file  Tobacco Use  . Smoking status: Never  . Smokeless tobacco: Never  Vaping Use  . Vaping Use: Never used  Substance and Sexual Activity  . Alcohol use: Never  . Drug use: Never  . Sexual activity: Not on file  Other Topics Concern  . Not on file  Social History Narrative  . Not on file   Social Determinants of Health   Financial Resource Strain: Not on file  Food Insecurity: Not on file  Transportation Needs: Not on file  Physical Activity: Not on file  Stress: Not on file  Social Connections: Not on file  Intimate Partner Violence: Not on file    History reviewed. No pertinent family history. No Known Allergies I  Current Facility-Administered Medications  Medication Dose Route Frequency Provider Last Rate Last Admin  . 0.9 %  sodium chloride infusion   Intravenous Continuous Lorella Nimrod, MD 100 mL/hr at  01/17/22 0335 New Bag at 01/17/22 0335  . acetaminophen (TYLENOL) tablet 650 mg  650 mg Oral Q6H PRN Arnetha Courser, MD   650 mg at 01/17/22 0214   Or  . acetaminophen (TYLENOL) suppository 650 mg  650 mg Rectal Q6H PRN Arnetha Courser, MD      . cefTRIAXone (ROCEPHIN) 2 g in sodium chloride 0.9 % 100 mL IVPB  2 g Intravenous Q24H Arnetha Courser, MD 200 mL/hr at 01/17/22 1011 2 g at 01/17/22 1011  . cyclobenzaprine (FLEXERIL) tablet 10 mg  10 mg Oral TID PRN Darlin Priestly, MD   10 mg at 01/17/22 1007  . naproxen (NAPROSYN) tablet 500 mg  500 mg Oral BID PRN Darlin Priestly, MD      . ondansetron [facility]) tablet 4 mg  4 mg Oral Q6H PRN  Arnetha Courser, MD       Or  . ondansetron (ZOFRAN) injection 4 mg  4 mg Intravenous Q6H PRN Arnetha Courser, MD      . polyethylene glycol (MIRALAX / GLYCOLAX) packet 17 g  17 g Oral Daily PRN Arnetha Courser, MD   17 g at 01/17/22 1008  . potassium chloride SA (KLOR-CON M) CR tablet 40 mEq  40 mEq Oral Q4H Darlin Priestly, MD      . sodium chloride flush (NS) 0.9 % injection 3 mL  3 mL Intravenous Q12H Arnetha Courser, MD   3 mL at 01/16/22 2153     Abtx:  Anti-infectives (From admission, onward)    Start     Dose/Rate Route Frequency Ordered Stop   01/17/22 1000  cefTRIAXone (ROCEPHIN) 2 g in sodium chloride 0.9 % 100 mL IVPB        2 g 200 mL/hr over 30 Minutes Intravenous Every 24 hours 01/16/22 1903     01/16/22 1345  ceFEPIme (MAXIPIME) 2 g in sodium chloride 0.9 % 100 mL IVPB        2 g 200 mL/hr over 30 Minutes Intravenous  Once 01/16/22 1341 01/16/22 1452   01/16/22 1345  metroNIDAZOLE (FLAGYL) IVPB 500 mg        500 mg 100 mL/hr over 60 Minutes Intravenous  Once 01/16/22 1341 01/16/22 1541   01/16/22 1345  vancomycin (VANCOCIN) IVPB 1000 mg/200 mL premix        1,000 mg 200 mL/hr over 60 Minutes Intravenous  Once 01/16/22 1341 01/16/22 1542       REVIEW OF SYSTEMS:  Const:  fever,  chills, negative weight loss Eyes: negative diplopia or visual changes, negative eye pain ENT: negative coryza, negative sore throat Resp: negative cough, hemoptysis, dyspnea Cards: negative for chest pain, palpitations, lower extremity edema GU: Concentrated urine. GI: Negative for abdominal pain, diarrhea, bleeding, +constipation Skin: negative for rash and pruritus Heme: negative for easy bruising and gum/nose bleeding MS: Back pain and left elbow pain Neurolo:negative for headaches, dizziness, vertigo, memory problems  Psych: negative for feelings of anxiety, depression  Endocrine: negative for thyroid, diabetes Allergy/Immunology- As above  Objective:  VITALS:  BP 112/62 (BP Location: Right  Arm)   Pulse 100   Temp 99 F (37.2 C)   Resp 16   Ht 5\' 1"  (1.549 m)   Wt 68.6 kg   SpO2 96%   BMI 28.59 kg/m   PHYSICAL EXAM:  General: Alert, cooperative, looks ill, pale Head: Normocephalic, without obvious abnormality, atraumatic. Eyes: Conjunctivae clear, anicteric sclerae. Pupils are equal ENT Nares normal. No drainage or sinus tenderness. Lips, mucosa, and tongue normal.  No Thrush Neck: Supple, symmetrical, no adenopathy, thyroid: non tender no carotid bruit and no JVD. Tenderness over the mid back Back: No CVA tenderness. Lungs: Clear air entry. Decreased in the bases Heart: Tachycardia Abdomen: Soft, non-tender,not distended. Bowel sounds normal. No masses Extremities: left arm near elbow swollen-   Skin: No rashes or lesions. Or bruising Lymph: Cervical, supraclavicular normal. Neurologic: Grossly non-focal Pertinent Labs Lab Results CBC    Component Value Date/Time   WBC 6.8 01/17/2022 0517   RBC 2.81 (L) 01/17/2022 0517   HGB 9.2 (L) 01/17/2022 0517   HCT 26.7 (L) 01/17/2022 0517   PLT 50 (L) 01/17/2022 0517   MCV 95.0 01/17/2022 0517   MCH 32.7 01/17/2022 0517   MCHC 34.5 01/17/2022 0517   RDW 13.7 01/17/2022 0517       Latest Ref Rng & Units 01/17/2022    5:17 AM 01/16/2022   11:30 AM  CMP  Glucose 70 - 99 mg/dL 846  962   BUN 8 - 23 mg/dL 14  35   Creatinine 9.52 - 1.00 mg/dL 8.41  3.24   Sodium 401 - 145 mmol/L 134  129   Potassium 3.5 - 5.1 mmol/L 2.8  3.7   Chloride 98 - 111 mmol/L 108  98   CO2 22 - 32 mmol/L 19  20   Calcium 8.9 - 10.3 mg/dL 7.7  8.6   Total Protein 6.5 - 8.1 g/dL  7.1   Total Bilirubin 0.3 - 1.2 mg/dL  1.3   Alkaline Phos 38 - 126 U/L  98   AST 15 - 41 U/L  27   ALT 0 - 44 U/L  52       Microbiology: Recent Results (from the past 240 hour(s))  Resp Panel by RT-PCR (Flu A&B, Covid) Anterior Nasal Swab     Status: None   Collection Time: 01/15/22  5:30 PM   Specimen: Anterior Nasal Swab  Result Value Ref  Range Status   SARS Coronavirus 2 by RT PCR NEGATIVE NEGATIVE Final    Comment: (NOTE) SARS-CoV-2 target nucleic acids are NOT DETECTED.  The SARS-CoV-2 RNA is generally detectable in upper respiratory specimens during the acute phase of infection. The lowest concentration of SARS-CoV-2 viral copies this assay can detect is 138 copies/mL. A negative result does not preclude SARS-Cov-2 infection and should not be used as the sole basis for treatment or other patient management decisions. A negative result may occur with  improper specimen collection/handling, submission of specimen other than nasopharyngeal swab, presence of viral mutation(s) within the areas targeted by this assay, and inadequate number of viral copies(<138 copies/mL). A negative result must be combined with clinical observations, patient history, and epidemiological information. The expected result is Negative.  Fact Sheet for Patients:  BloggerCourse.com  Fact Sheet for Healthcare Providers:  SeriousBroker.it  This test is no t yet approved or cleared by the [place] FDA and  has been authorized for detection and/or diagnosis of SARS-CoV-2 by FDA under an Emergency Use Authorization (EUA). This EUA will remain  in effect (meaning this test can be used) for the duration of the COVID-19 declaration under Section 564(b)(1) of the Act, 21 U.S.C.section 360bbb-3(b)(1), unless the authorization is terminated  or revoked sooner.       Influenza A by PCR NEGATIVE NEGATIVE Final   Influenza B by PCR NEGATIVE NEGATIVE Final    Comment: (NOTE) The Xpert Xpress SARS-CoV-2/FLU/RSV plus assay is intended as an aid in the diagnosis of influenza  from Nasopharyngeal swab specimens and should not be used as a sole basis for treatment. Nasal washings and aspirates are unacceptable for Xpert Xpress SARS-CoV-2/FLU/RSV testing.  Fact Sheet for  Patients: EntrepreneurPulse.com.au  Fact Sheet for Healthcare Providers: IncredibleEmployment.be  This test is not yet approved or cleared by the [place] FDA and has been authorized for detection and/or diagnosis of SARS-CoV-2 by FDA under an Emergency Use Authorization (EUA). This EUA will remain in effect (meaning this test can be used) for the duration of the COVID-19 declaration under Section 564(b)(1) of the Act, 21 U.S.C. section 360bbb-3(b)(1), unless the authorization is terminated or revoked.  Performed at [facility], [address], [place], [place] [address]   Urine Culture     Status: None (Preliminary result)   Collection Time: 01/16/22 11:30 AM   Specimen: Urine, Random  Result Value Ref Range Status   Specimen Description   Final    URINE, RANDOM Performed at [facility], [address] [place], [place] [address]    Special Requests   Final    NONE Performed at [facility], [address] [place], [place] [address]    Culture   Final    CULTURE REINCUBATED FOR BETTER GROWTH Performed at [facility], [place] [address], [place], [place] [address]    Report Status PENDING  Incomplete  Blood culture (routine x 2)     Status: None (Preliminary result)   Collection Time: 01/16/22  1:58 PM   Specimen: BLOOD  Result Value Ref Range Status   Specimen Description   Final    BLOOD RIGHT WRIST Performed at [facility], [address] [place], [place] [address]    Special Requests   Final    BOTTLES DRAWN AEROBIC AND ANAEROBIC Blood Culture results may not be optimal due to an inadequate volume of blood received in culture bottles Performed at [facility], [address] [place], [place] [address]    Culture  Setup Time   Final    Organism ID to follow [place] CRITICAL RESULT CALLED TO, READ BACK BY AND VERIFIED WITH: JASON  ROBBINS @ 0139 ON 01/17/2022.Marland KitchenMarland KitchenTKR    Culture GRAM POSITIVE COCCI  Final   Report Status PENDING  Incomplete  Blood culture (routine x 2)     Status: None (Preliminary result)   Collection Time: 01/16/22  1:58 PM   Specimen: BLOOD  Result Value Ref Range Status   Specimen Description BLOOD LEFT HAND  Final   Special Requests   Final    BOTTLES DRAWN AEROBIC AND ANAEROBIC Blood Culture results may not be optimal due to an inadequate volume of blood received in culture bottles   Culture  Setup Time   Final    Organism ID to follow ANAEROBIC BOTTLE ONLY GRAM POSITIVE RODS CRITICAL RESULT CALLED TO, READ BACK BY AND VERIFIED WITHLu Duffel [facility] [id number] 01/17/22 HNM Performed at [facility], [address] [place],  [address]    Culture GRAM POSITIVE RODS  Final   Report Status PENDING  Incomplete  Blood Culture ID Panel (Reflexed)     Status: Abnormal   Collection Time: 01/16/22  1:58 PM  Result Value Ref Range Status   Enterococcus faecalis NOT DETECTED NOT DETECTED Final   Enterococcus Faecium NOT DETECTED NOT DETECTED Final   Listeria monocytogenes NOT DETECTED NOT DETECTED Final   Staphylococcus species NOT DETECTED NOT DETECTED Final   Staphylococcus  aureus (BCID) NOT DETECTED NOT DETECTED Final   Staphylococcus epidermidis NOT DETECTED NOT DETECTED Final   Staphylococcus lugdunensis NOT DETECTED NOT DETECTED Final   Streptococcus species DETECTED (A) NOT DETECTED Final    Comment: Not Enterococcus species, Streptococcus agalactiae, Streptococcus pyogenes, or Streptococcus pneumoniae. CRITICAL RESULT CALLED TO, READ BACK BY AND VERIFIED WITH: JASON ROBBINS @ 0139 ON 01/17/2022.Marland KitchenMarland KitchenTKR    Streptococcus agalactiae NOT DETECTED NOT DETECTED Final   Streptococcus pneumoniae NOT DETECTED NOT DETECTED Final   Streptococcus pyogenes NOT DETECTED NOT DETECTED Final   A.calcoaceticus-baumannii NOT DETECTED NOT DETECTED Final   Bacteroides fragilis NOT DETECTED NOT  DETECTED Final   Enterobacterales NOT DETECTED NOT DETECTED Final   Enterobacter cloacae complex NOT DETECTED NOT DETECTED Final   Escherichia coli NOT DETECTED NOT DETECTED Final   Klebsiella aerogenes NOT DETECTED NOT DETECTED Final   Klebsiella oxytoca NOT DETECTED NOT DETECTED Final   Klebsiella pneumoniae NOT DETECTED NOT DETECTED Final   Proteus species NOT DETECTED NOT DETECTED Final   Salmonella species NOT DETECTED NOT DETECTED Final   Serratia marcescens NOT DETECTED NOT DETECTED Final   Haemophilus influenzae NOT DETECTED NOT DETECTED Final   Neisseria meningitidis NOT DETECTED NOT DETECTED Final   Pseudomonas aeruginosa NOT DETECTED NOT DETECTED Final   Stenotrophomonas maltophilia NOT DETECTED NOT DETECTED Final   Candida albicans NOT DETECTED NOT DETECTED Final   Candida auris NOT DETECTED NOT DETECTED Final   Candida glabrata NOT DETECTED NOT DETECTED Final   Candida krusei NOT DETECTED NOT DETECTED Final   Candida parapsilosis NOT DETECTED NOT DETECTED Final   Candida tropicalis NOT DETECTED NOT DETECTED Final   Cryptococcus neoformans/gattii NOT DETECTED NOT DETECTED Final    Comment: Performed at [facility], [address] [place], [place] [address]    IMAGING RESULTS:  I have personally reviewed the films Atelectasis b/l lower lobe   Impression/Recommendation  61 yr female presenting with chills, weakness, back pain, left arm pain and not feeling well. Found to have fever  Fever      ___________________________________________________ Discussed with patient, requesting provider Note:  This document was prepared using Dragon voice recognition software and may include unintentional dictation errors.

## 2022-01-17 NOTE — Progress Notes (Signed)
PHARMACY - PHYSICIAN COMMUNICATION CRITICAL VALUE ALERT - BLOOD CULTURE IDENTIFICATION (BCID)  Molly Krueger is an 61 y.o. female who presented to [facility] on 01/16/2022 with a chief complaint of sepsis.   Assessment:  Strep species in 1 of 4 bottles (anaerobic), no resistance detected.  (include suspected source if known)  Addendum: anaerobic bottle in 2nd set with GPR - No BCID performed in these cases  Informed: Dr Fran Lowes   Name of physician (or Provider) Contacted: Manuela Schwartz, NP   Current antibiotics: Ceftriaxone 2 gm IV Q24H   Changes to prescribed antibiotics recommended:    Results for orders placed or performed during the hospital encounter of 01/16/22  Blood Culture ID Panel (Reflexed) (Collected: 01/16/2022  1:58 PM)  Result Value Ref Range   Enterococcus faecalis NOT DETECTED NOT DETECTED   Enterococcus Faecium NOT DETECTED NOT DETECTED   Listeria monocytogenes NOT DETECTED NOT DETECTED   Staphylococcus species NOT DETECTED NOT DETECTED   Staphylococcus aureus (BCID) NOT DETECTED NOT DETECTED   Staphylococcus epidermidis NOT DETECTED NOT DETECTED   Staphylococcus lugdunensis NOT DETECTED NOT DETECTED   Streptococcus species DETECTED (A) NOT DETECTED   Streptococcus agalactiae NOT DETECTED NOT DETECTED   Streptococcus pneumoniae NOT DETECTED NOT DETECTED   Streptococcus pyogenes NOT DETECTED NOT DETECTED   A.calcoaceticus-baumannii NOT DETECTED NOT DETECTED   Bacteroides fragilis NOT DETECTED NOT DETECTED   Enterobacterales NOT DETECTED NOT DETECTED   Enterobacter cloacae complex NOT DETECTED NOT DETECTED   Escherichia coli NOT DETECTED NOT DETECTED   Klebsiella aerogenes NOT DETECTED NOT DETECTED   Klebsiella oxytoca NOT DETECTED NOT DETECTED   Klebsiella pneumoniae NOT DETECTED NOT DETECTED   Proteus species NOT DETECTED NOT DETECTED   Salmonella species NOT DETECTED NOT DETECTED   Serratia marcescens NOT DETECTED NOT DETECTED   Haemophilus  influenzae NOT DETECTED NOT DETECTED   Neisseria meningitidis NOT DETECTED NOT DETECTED   Pseudomonas aeruginosa NOT DETECTED NOT DETECTED   Stenotrophomonas maltophilia NOT DETECTED NOT DETECTED   Candida albicans NOT DETECTED NOT DETECTED   Candida auris NOT DETECTED NOT DETECTED   Candida glabrata NOT DETECTED NOT DETECTED   Candida krusei NOT DETECTED NOT DETECTED   Candida parapsilosis NOT DETECTED NOT DETECTED   Candida tropicalis NOT DETECTED NOT DETECTED   Cryptococcus neoformans/gattii NOT DETECTED NOT DETECTED   Juliette Alcide, PharmD, BCPS, BCIDP Work Cell: [phone] 01/17/2022 1:54 PM

## 2022-01-17 NOTE — Consult Note (Incomplete)
NAME: Molly Krueger  DOB: September 07, 1960  MRN: [id number]  Date/Time: 01/17/2022 1:59 PM  REQUESTING PROVIDER: Dr.Lai Subjective:  REASON FOR CONSULT: bacteremia ? Molly Krueger is a 61 y.o. female presents with sob, fever, left arm pain , lethargy  [facility] temp 101, BP 93/69, Pulse 108 and sats 95%. Thought to have viral fever . Asked to take NSAID. She came to the ED  on 01/16/22 with cramping left arm pain and worsening fever. Vitals in the ED 100/72, pulse 102, temp 98. Na 129, cr 1.21, glucose 149, ALT 52,ast 27, Tbili 1.3, wbc 9.8, HB 12.2, plt 60 Xray elbow showed effusion CXR linear streaks lower lobe suggestive of atelectasis  ID   Steroid/immune suppressants/splenectomy/Hardware Recent Procedure Surgery Injections Trauma Sick contacts Travel Antibiotic use Food- raw/exotic Animal bites Tick exposure Water sports Fishing/hunting/animal bird exposure History reviewed. No pertinent past medical history.  Past Surgical History:  Procedure Laterality Date   KNEE SURGERY Left    KNEE SURGERY  2020   2020-right,    Social History   Socioeconomic History   Marital status: Married    Spouse name: Not on file   Number of children: Not on file   Years of education: Not on file   Highest education level: Not on file  Occupational History   Not on file  Tobacco Use   Smoking status: Never   Smokeless tobacco: Never  Vaping Use   Vaping Use: Never used  Substance and Sexual Activity   Alcohol use: Never   Drug use: Never   Sexual activity: Not on file  Other Topics Concern   Not on file  Social History Narrative   Not on file   Social Determinants of Health   Financial Resource Strain: Not on file  Food Insecurity: Not on file  Transportation Needs: Not on file  Physical Activity: Not on file  Stress: Not on file  Social Connections: Not on file  Intimate Partner Violence: Not on file    History reviewed. No  pertinent family history. No Known Allergies I? Current Facility-Administered Medications  Medication Dose Route Frequency Provider Last Rate Last Admin   0.9 %  sodium chloride infusion   Intravenous Continuous Arnetha Courser, MD 100 mL/hr at 01/17/22 0335 New Bag at 01/17/22 0335   acetaminophen (TYLENOL) tablet 650 mg  650 mg Oral Q6H PRN Arnetha Courser, MD   650 mg at 01/17/22 0214   Or   acetaminophen (TYLENOL) suppository 650 mg  650 mg Rectal Q6H PRN Arnetha Courser, MD       cefTRIAXone (ROCEPHIN) 2 g in sodium chloride 0.9 % 100 mL IVPB  2 g Intravenous Q24H Arnetha Courser, MD 200 mL/hr at 01/17/22 1011 2 g at 01/17/22 1011   cyclobenzaprine (FLEXERIL) tablet 10 mg  10 mg Oral TID PRN Darlin Priestly, MD   10 mg at 01/17/22 1007   naproxen (NAPROSYN) tablet 500 mg  500 mg Oral BID PRN Darlin Priestly, MD       ondansetron [facility]) tablet 4 mg  4 mg Oral Q6H PRN Arnetha Courser, MD       Or   ondansetron (ZOFRAN) injection 4 mg  4 mg Intravenous Q6H PRN Arnetha Courser, MD       polyethylene glycol (MIRALAX / GLYCOLAX) packet 17 g  17 g Oral Daily PRN Arnetha Courser, MD   17 g at 01/17/22 1008   potassium chloride SA (KLOR-CON M) CR tablet 40 mEq  40 mEq Oral Q4H Darlin Priestly, MD       sodium chloride flush (NS) 0.9 % injection 3 mL  3 mL Intravenous Q12H Arnetha Courser, MD   3 mL at 01/16/22 2153     Abtx:  Anti-infectives (From admission, onward)    Start     Dose/Rate Route Frequency Ordered Stop   01/17/22 1000  cefTRIAXone (ROCEPHIN) 2 g in sodium chloride 0.9 % 100 mL IVPB        2 g 200 mL/hr over 30 Minutes Intravenous Every 24 hours 01/16/22 1903     01/16/22 1345  ceFEPIme (MAXIPIME) 2 g in sodium chloride 0.9 % 100 mL IVPB        2 g 200 mL/hr over 30 Minutes Intravenous  Once 01/16/22 1341 01/16/22 1452   01/16/22 1345  metroNIDAZOLE (FLAGYL) IVPB 500 mg        500 mg 100 mL/hr over 60 Minutes Intravenous  Once 01/16/22 1341 01/16/22 1541   01/16/22 1345  vancomycin (VANCOCIN) IVPB 1000  mg/200 mL premix        1,000 mg 200 mL/hr over 60 Minutes Intravenous  Once 01/16/22 1341 01/16/22 1542       REVIEW OF SYSTEMS:  Const: negative fever, negative chills, negative weight loss Eyes: negative diplopia or visual changes, negative eye pain ENT: negative coryza, negative sore throat Resp: negative cough, hemoptysis, dyspnea Cards: negative for chest pain, palpitations, lower extremity edema GU: negative for frequency, dysuria and hematuria GI: Negative for abdominal pain, diarrhea, bleeding, constipation Skin: negative for rash and pruritus Heme: negative for easy bruising and gum/nose bleeding MS: negative for myalgias, arthralgias, back pain and muscle weakness Neurolo:negative for headaches, dizziness, vertigo, memory problems  Psych: negative for feelings of anxiety, depression  Endocrine: negative for thyroid, diabetes Allergy/Immunology- negative for any medication or food allergies ? Pertinent Positives include : Objective:  VITALS:  BP 112/62 (BP Location: Right Arm)   Pulse 100   Temp 99 F (37.2 C)   Resp 16   Ht 5\' 1"  (1.549 m)   Wt 68.6 kg   SpO2 96%   BMI 28.59 kg/m  LDA Foley Central line Other drainage tubes PHYSICAL EXAM:  General: Alert, cooperative, no distress, appears stated age.  Head: Normocephalic, without obvious abnormality, atraumatic. Eyes: Conjunctivae clear, anicteric sclerae. Pupils are equal ENT Nares normal. No drainage or sinus tenderness. Lips, mucosa, and tongue normal. No Thrush Neck: Supple, symmetrical, no adenopathy, thyroid: non tender no carotid bruit and no JVD. Back: No CVA tenderness. Lungs: Clear to auscultation bilaterally. No Wheezing or Rhonchi. No rales. Heart: Regular rate and rhythm, no murmur, rub or gallop. Abdomen: Soft, non-tender,not distended. Bowel sounds normal. No masses Extremities: atraumatic, no cyanosis. No edema. No clubbing Skin: No rashes or lesions. Or bruising Lymph: Cervical,  supraclavicular normal. Neurologic: Grossly non-focal Pertinent Labs Lab Results CBC    Component Value Date/Time   WBC 6.8 01/17/2022 0517   RBC 2.81 (L) 01/17/2022 0517   HGB 9.2 (L) 01/17/2022 0517   HCT 26.7 (L) 01/17/2022 0517   PLT 50 (L) 01/17/2022 0517   MCV 95.0 01/17/2022 0517   MCH 32.7 01/17/2022 0517   MCHC 34.5 01/17/2022 0517   RDW 13.7 01/17/2022 0517       Latest Ref Rng & Units 01/17/2022    5:17 AM 01/16/2022   11:30 AM  CMP  Glucose 70 - 99 mg/dL 01/18/2022  916   BUN 8 - 23 mg/dL 14  35  Creatinine 0.44 - 1.00 mg/dL 8.56  3.14   Sodium 970 - 145 mmol/L 134  129   Potassium 3.5 - 5.1 mmol/L 2.8  3.7   Chloride 98 - 111 mmol/L 108  98   CO2 22 - 32 mmol/L 19  20   Calcium 8.9 - 10.3 mg/dL 7.7  8.6   Total Protein 6.5 - 8.1 g/dL  7.1   Total Bilirubin 0.3 - 1.2 mg/dL  1.3   Alkaline Phos 38 - 126 U/L  98   AST 15 - 41 U/L  27   ALT 0 - 44 U/L  52       Microbiology: Recent Results (from the past 240 hour(s))  Resp Panel by RT-PCR (Flu A&B, Covid) Anterior Nasal Swab     Status: None   Collection Time: 01/15/22  5:30 PM   Specimen: Anterior Nasal Swab  Result Value Ref Range Status   SARS Coronavirus 2 by RT PCR NEGATIVE NEGATIVE Final    Comment: (NOTE) SARS-CoV-2 target nucleic acids are NOT DETECTED.  The SARS-CoV-2 RNA is generally detectable in upper respiratory specimens during the acute phase of infection. The lowest concentration of SARS-CoV-2 viral copies this assay can detect is 138 copies/mL. A negative result does not preclude SARS-Cov-2 infection and should not be used as the sole basis for treatment or other patient management decisions. A negative result may occur with  improper specimen collection/handling, submission of specimen other than nasopharyngeal swab, presence of viral mutation(s) within the areas targeted by this assay, and inadequate number of viral copies(<138 copies/mL). A negative result must be combined with clinical  observations, patient history, and epidemiological information. The expected result is Negative.  Fact Sheet for Patients:  BloggerCourse.com  Fact Sheet for Healthcare Providers:  SeriousBroker.it  This test is no t yet approved or cleared by the [place] FDA and  has been authorized for detection and/or diagnosis of SARS-CoV-2 by FDA under an Emergency Use Authorization (EUA). This EUA will remain  in effect (meaning this test can be used) for the duration of the COVID-19 declaration under Section 564(b)(1) of the Act, 21 U.S.C.section 360bbb-3(b)(1), unless the authorization is terminated  or revoked sooner.       Influenza A by PCR NEGATIVE NEGATIVE Final   Influenza B by PCR NEGATIVE NEGATIVE Final    Comment: (NOTE) The Xpert Xpress SARS-CoV-2/FLU/RSV plus assay is intended as an aid in the diagnosis of influenza from Nasopharyngeal swab specimens and should not be used as a sole basis for treatment. Nasal washings and aspirates are unacceptable for Xpert Xpress SARS-CoV-2/FLU/RSV testing.  Fact Sheet for Patients: BloggerCourse.com  Fact Sheet for Healthcare Providers: SeriousBroker.it  This test is not yet approved or cleared by the [place] FDA and has been authorized for detection and/or diagnosis of SARS-CoV-2 by FDA under an Emergency Use Authorization (EUA). This EUA will remain in effect (meaning this test can be used) for the duration of the COVID-19 declaration under Section 564(b)(1) of the Act, 21 U.S.C. section 360bbb-3(b)(1), unless the authorization is terminated or revoked.  Performed at [facility], [address], [place], [place] [address]   Urine Culture     Status: None (Preliminary result)   Collection Time: 01/16/22 11:30 AM   Specimen: Urine, Random  Result Value Ref Range Status   Specimen Description   Final     URINE, RANDOM Performed at [facility], [address], [place], [place] [address]    Special  Requests   Final    NONE Performed at [facility], [address] [place], [place] [address]    Culture   Final    CULTURE REINCUBATED FOR BETTER GROWTH Performed at [facility], [address] [place], [place] [address]    Report Status PENDING  Incomplete  Blood culture (routine x 2)     Status: None (Preliminary result)   Collection Time: 01/16/22  1:58 PM   Specimen: BLOOD  Result Value Ref Range Status   Specimen Description   Final    BLOOD RIGHT WRIST Performed at [facility], [address] [place], [place] [address]    Special Requests   Final    BOTTLES DRAWN AEROBIC AND ANAEROBIC Blood Culture results may not be optimal due to an inadequate volume of blood received in culture bottles Performed at [facility], [address] [place], [place] [address]    Culture  Setup Time   Final    Organism ID to follow GRAM POSITIVE COCCI ANAEROBIC BOTTLE ONLY CRITICAL RESULT CALLED TO, READ BACK BY AND VERIFIED WITH: JASON ROBBINS @ 0139 ON 01/17/2022.Marland Kitchen.Marland Kitchen.TKR    Culture GRAM POSITIVE COCCI  Final   Report Status PENDING  Incomplete  Blood culture (routine x 2)     Status: None (Preliminary result)   Collection Time: 01/16/22  1:58 PM   Specimen: BLOOD  Result Value Ref Range Status   Specimen Description BLOOD LEFT HAND  Final   Special Requests   Final    BOTTLES DRAWN AEROBIC AND ANAEROBIC Blood Culture results may not be optimal due to an inadequate volume of blood received in culture bottles   Culture  Setup Time   Final    Organism ID to follow ANAEROBIC BOTTLE ONLY GRAM POSITIVE RODS CRITICAL RESULT CALLED TO, READ BACK BY AND VERIFIED WITHDrusilla Kanner: DEVAN MITCHELL [facility] [id number] 01/17/22 HNM Performed at [facility], [address] [place], [place] [address]    Culture GRAM POSITIVE RODS  Final   Report Status PENDING   Incomplete  Blood Culture ID Panel (Reflexed)     Status: Abnormal   Collection Time: 01/16/22  1:58 PM  Result Value Ref Range Status   Enterococcus faecalis NOT DETECTED NOT DETECTED Final   Enterococcus Faecium NOT DETECTED NOT DETECTED Final   Listeria monocytogenes NOT DETECTED NOT DETECTED Final   Staphylococcus species NOT DETECTED NOT DETECTED Final   Staphylococcus aureus (BCID) NOT DETECTED NOT DETECTED Final   Staphylococcus epidermidis NOT DETECTED NOT DETECTED Final   Staphylococcus lugdunensis NOT DETECTED NOT DETECTED Final   Streptococcus species DETECTED (A) NOT DETECTED Final    Comment: Not Enterococcus species, Streptococcus agalactiae, Streptococcus pyogenes, or Streptococcus pneumoniae. CRITICAL RESULT CALLED TO, READ BACK BY AND VERIFIED WITH: JASON ROBBINS @ 0139 ON 01/17/2022.Marland Kitchen.Marland Kitchen.TKR    Streptococcus agalactiae NOT DETECTED NOT DETECTED Final   Streptococcus pneumoniae NOT DETECTED NOT DETECTED Final   Streptococcus pyogenes NOT DETECTED NOT DETECTED Final   A.calcoaceticus-baumannii NOT DETECTED NOT DETECTED Final   Bacteroides fragilis NOT DETECTED NOT DETECTED Final   Enterobacterales NOT DETECTED NOT DETECTED Final   Enterobacter cloacae complex NOT DETECTED NOT DETECTED Final   Escherichia coli NOT DETECTED NOT DETECTED Final   Klebsiella aerogenes NOT DETECTED NOT DETECTED Final   Klebsiella oxytoca NOT DETECTED NOT DETECTED Final   Klebsiella pneumoniae NOT DETECTED NOT DETECTED Final   Proteus species NOT DETECTED NOT DETECTED Final   Salmonella species NOT DETECTED NOT DETECTED  Final   Serratia marcescens NOT DETECTED NOT DETECTED Final   Haemophilus influenzae NOT DETECTED NOT DETECTED Final   Neisseria meningitidis NOT DETECTED NOT DETECTED Final   Pseudomonas aeruginosa NOT DETECTED NOT DETECTED Final   Stenotrophomonas maltophilia NOT DETECTED NOT DETECTED Final   Candida albicans NOT DETECTED NOT DETECTED Final   Candida auris NOT DETECTED NOT  DETECTED Final   Candida glabrata NOT DETECTED NOT DETECTED Final   Candida krusei NOT DETECTED NOT DETECTED Final   Candida parapsilosis NOT DETECTED NOT DETECTED Final   Candida tropicalis NOT DETECTED NOT DETECTED Final   Cryptococcus neoformans/gattii NOT DETECTED NOT DETECTED Final    Comment: Performed at [facility], [address] [place], [place] [address]    IMAGING RESULTS: I have personally reviewed the films ? Impression/Recommendation ? ? ? ___________________________________________________ Discussed with patient, requesting provider Note:  This document was prepared using Dragon voice recognition software and may include unintentional dictation errors.

## 2022-01-17 NOTE — Assessment & Plan Note (Signed)
--  ortho attempted needle aspiration of joint fluid, but no return

## 2022-01-17 NOTE — Assessment & Plan Note (Signed)
--  monitor and replete PRN ?

## 2022-01-17 NOTE — Progress Notes (Signed)
PHARMACY - PHYSICIAN COMMUNICATION CRITICAL VALUE ALERT - BLOOD CULTURE IDENTIFICATION (BCID)  Molly Krueger is an 61 y.o. female who presented to [facility] on 01/16/2022 with a chief complaint of sepsis.   Assessment:  Strep species in 1 of 4 bottles (anaerobic), no resistance detected.  (include suspected source if known)  Name of physician (or Provider) Contacted: Manuela Schwartz, NP   Current antibiotics: Ceftriaxone 2 gm IV Q24H   Changes to prescribed antibiotics recommended:  Pt is on recommended abx.   Results for orders placed or performed during the hospital encounter of 01/16/22  Blood Culture ID Panel (Reflexed) (Collected: 01/16/2022  1:58 PM)  Result Value Ref Range   Enterococcus faecalis NOT DETECTED NOT DETECTED   Enterococcus Faecium NOT DETECTED NOT DETECTED   Listeria monocytogenes NOT DETECTED NOT DETECTED   Staphylococcus species NOT DETECTED NOT DETECTED   Staphylococcus aureus (BCID) NOT DETECTED NOT DETECTED   Staphylococcus epidermidis NOT DETECTED NOT DETECTED   Staphylococcus lugdunensis NOT DETECTED NOT DETECTED   Streptococcus species DETECTED (A) NOT DETECTED   Streptococcus agalactiae NOT DETECTED NOT DETECTED   Streptococcus pneumoniae NOT DETECTED NOT DETECTED   Streptococcus pyogenes NOT DETECTED NOT DETECTED   A.calcoaceticus-baumannii NOT DETECTED NOT DETECTED   Bacteroides fragilis NOT DETECTED NOT DETECTED   Enterobacterales NOT DETECTED NOT DETECTED   Enterobacter cloacae complex NOT DETECTED NOT DETECTED   Escherichia coli NOT DETECTED NOT DETECTED   Klebsiella aerogenes NOT DETECTED NOT DETECTED   Klebsiella oxytoca NOT DETECTED NOT DETECTED   Klebsiella pneumoniae NOT DETECTED NOT DETECTED   Proteus species NOT DETECTED NOT DETECTED   Salmonella species NOT DETECTED NOT DETECTED   Serratia marcescens NOT DETECTED NOT DETECTED   Haemophilus influenzae NOT DETECTED NOT DETECTED   Neisseria meningitidis NOT DETECTED NOT DETECTED    Pseudomonas aeruginosa NOT DETECTED NOT DETECTED   Stenotrophomonas maltophilia NOT DETECTED NOT DETECTED   Candida albicans NOT DETECTED NOT DETECTED   Candida auris NOT DETECTED NOT DETECTED   Candida glabrata NOT DETECTED NOT DETECTED   Candida krusei NOT DETECTED NOT DETECTED   Candida parapsilosis NOT DETECTED NOT DETECTED   Candida tropicalis NOT DETECTED NOT DETECTED   Cryptococcus neoformans/gattii NOT DETECTED NOT DETECTED    [name] 01/17/2022  1:54 AM

## 2022-01-17 NOTE — Progress Notes (Signed)
  Progress Note   Patient: Molly Krueger YBO:[id number] DOB: 06-Jun-1961 DOA: 01/16/2022     1 DOS: the patient was seen and examined on 01/17/2022   Brief hospital course: No notes on file  Assessment and Plan: * Sepsis [facility]) Patient met sepsis criteria with fever and tachycardia.  Source bacteremia. Received IV fluid and broad-spectrum antibiotics per sepsis protocol in ED. --Chest imaging with some concern of inflammatory changes versus pneumonia, no no respiratory symptoms.   AKI (acute kidney injury) ([place]) Most likely secondary to poor p.o. intake and dehydration.  Creatinine at 1.21 on presentation.  Improved to 0.61 the next day.  Hyponatremia Mild hyponatremia with sodium of 129, most likely secondary to poor p.o. intake as she appears dry. --improved with IVF.  Hypokalemia --monitor and replete PRN  Elbow joint effusion, left --ortho attempted needle aspiration of joint fluid, but no return  Streptococcal bacteremia --1 of 2 blood cx pos strep species, but not the typical kind Plan: --ID consult --repeat blood cx --cont ceftriaxone for now         Subjective:  Pt complained of being stiff all over, and generalized back pain.  Also feeling bloated.     Physical Exam:  Constitutional: NAD, AAOx3 HEENT: conjunctivae and lids normal, EOMI CV: No cyanosis.   RESP: normal respiratory effort, on RA Extremities: left elbow with a patch of mild erythema SKIN: warm, dry Neuro: II - XII grossly intact.   Psych: depressed mood and affect.  Appropriate judgement and reason   Data Reviewed:  Family Communication:   Disposition: Status is: Inpatient   Planned Discharge Destination: Home    Time spent: 50 minutes  Author: Enzo Bi, MD 01/17/2022 5:36 PM  For on call review [email or web address].

## 2022-01-18 ENCOUNTER — Inpatient Hospital Stay: Payer: BC Managed Care – PPO

## 2022-01-18 DIAGNOSIS — N179 Acute kidney failure, unspecified: Secondary | ICD-10-CM | POA: Diagnosis not present

## 2022-01-18 DIAGNOSIS — R6520 Severe sepsis without septic shock: Secondary | ICD-10-CM | POA: Diagnosis not present

## 2022-01-18 DIAGNOSIS — M00222 Other streptococcal arthritis, left elbow: Secondary | ICD-10-CM

## 2022-01-18 DIAGNOSIS — A419 Sepsis, unspecified organism: Secondary | ICD-10-CM | POA: Diagnosis not present

## 2022-01-18 LAB — BASIC METABOLIC PANEL
Anion gap: 5 (ref 5–15)
BUN: 9 mg/dL (ref 8–23)
CO2: 23 mmol/L (ref 22–32)
Calcium: 8.1 mg/dL — ABNORMAL LOW (ref 8.9–10.3)
Chloride: 110 mmol/L (ref 98–111)
Creatinine, Ser: 0.61 mg/dL (ref 0.44–1.00)
GFR, Estimated: >60 mL/min (ref >60)
Glucose, Bld: 138 mg/dL — ABNORMAL HIGH (ref 70–99)
Potassium: 3.2 mmol/L — ABNORMAL LOW (ref 3.5–5.1)
Sodium: 138 mmol/L (ref 135–145)

## 2022-01-18 LAB — SYNOVIAL CELL COUNT + DIFF, W/ CRYSTALS
Crystals, Fluid: NONE SEEN
Eosinophils-Synovial: 0 %
Lymphocytes-Synovial Fld: 2 %
Monocyte-Macrophage-Synovial Fluid: 3 %
Neutrophil, Synovial: 95 %

## 2022-01-18 LAB — CBC
HCT: 28.4 % — ABNORMAL LOW (ref 36.0–46.0)
Hemoglobin: 9.9 g/dL — ABNORMAL LOW (ref 12.0–15.0)
MCH: 33.3 pg (ref 26.0–34.0)
MCHC: 34.9 g/dL (ref 30.0–36.0)
MCV: 95.6 fL (ref 80.0–100.0)
Platelets: 60 10*3/uL — ABNORMAL LOW (ref 150–400)
RBC: 2.97 MIL/uL — ABNORMAL LOW (ref 3.87–5.11)
RDW: 13.8 % (ref 11.5–15.5)
WBC: 5.6 10*3/uL (ref 4.0–10.5)
nRBC: 0 % (ref 0.0–0.2)

## 2022-01-18 LAB — HEPATIC FUNCTION PANEL
ALT: 37 U/L (ref 0–44)
AST: 30 U/L (ref 15–41)
Albumin: 2.8 g/dL — ABNORMAL LOW (ref 3.5–5.0)
Alkaline Phosphatase: 96 U/L (ref 38–126)
Bilirubin, Direct: 0.2 mg/dL (ref 0.0–0.2)
Indirect Bilirubin: 0.5 mg/dL (ref 0.3–0.9)
Total Bilirubin: 0.7 mg/dL (ref 0.3–1.2)
Total Protein: 6.1 g/dL — ABNORMAL LOW (ref 6.5–8.1)

## 2022-01-18 LAB — LACTATE DEHYDROGENASE: LDH: 205 U/L — ABNORMAL HIGH (ref 98–192)

## 2022-01-18 LAB — URINE CULTURE

## 2022-01-18 LAB — PARVOVIRUS B19 ANTIBODY, IGG AND IGM
Parovirus B19 IgG Abs: 6.2 index — ABNORMAL HIGH (ref 0.0–0.8)
Parovirus B19 IgM Abs: 0.2 index (ref 0.0–0.8)

## 2022-01-18 LAB — PATHOLOGIST SMEAR REVIEW

## 2022-01-18 LAB — PROCALCITONIN: Procalcitonin: 1.42 ng/mL

## 2022-01-18 LAB — MAGNESIUM: Magnesium: 2.2 mg/dL (ref 1.7–2.4)

## 2022-01-18 LAB — HIV ANTIBODY (ROUTINE TESTING W REFLEX): HIV Screen 4th Generation wRfx: NONREACTIVE

## 2022-01-18 IMAGING — MR MR ELBOW*L* WO/W CM
9 series · 40 of 40 positions shown · IV contrast (gadavist)
Comparison: Left elbow x-rays dated [DATE].

CLINICAL DATA: Fever and left elbow pain. Bacteremia. History of
aplastic anemia.

EXAM:
MRI OF THE LEFT ELBOW WITHOUT AND WITH CONTRAST
TECHNIQUE: Multiplanar, multisequence MR imaging of the elbow was performed
before and after the administration of intravenous contrast.
CONTRAST:  7.5mL GADAVIST GADOBUTROL 1 MMOL/ML IV SOLN

[Series 9: T2 fat-sat · coronal · left · 3.0mm · 0.78mm/px · 4 of 30 slices shown (1 of 3)]
[im 1/30]
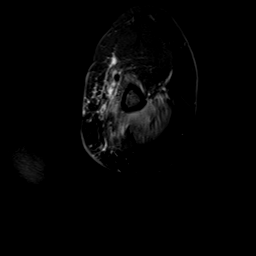
[im 10/30]
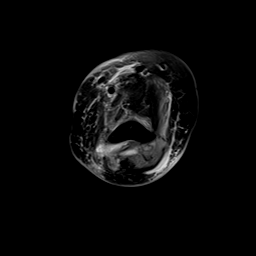
[im 20/30]
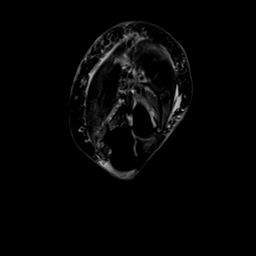
[im 30/30]
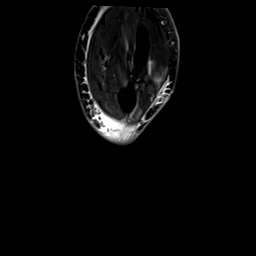

[Series 10: T1 · coronal · left · 3.0mm · 0.78mm/px · 4 of 30 slices shown (1 of 2)]
[im 1/30]
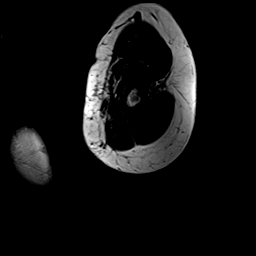
[im 10/30]
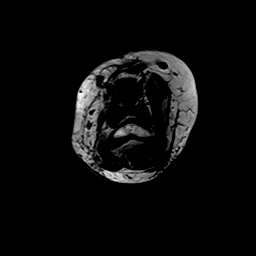
[im 20/30]
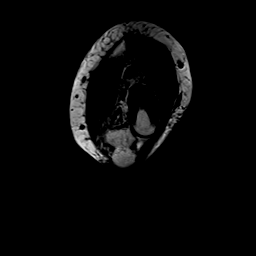
[im 30/30]
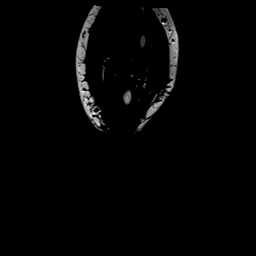

[Series 11: T2 fat-sat · axial · left · 3.0mm · 0.78mm/px · z∈[-174,-66]mm · 4 of 30 slices shown (2 of 3)]
[im 1/30]
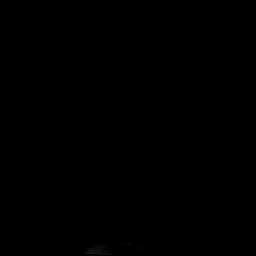
[im 10/30]
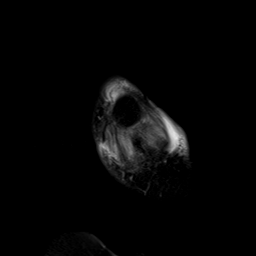
[im 20/30]
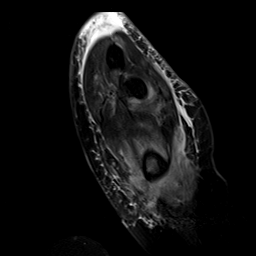
[im 30/30]
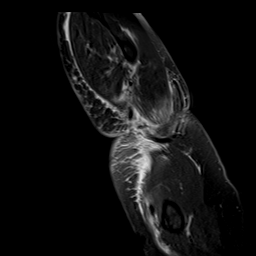

[Series 12: T1 · axial · left · 3.0mm · 0.55mm/px · z∈[-174,-66]mm · 5 of 30 slices shown (2 of 2)]
[im 1/30]
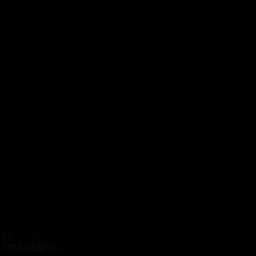
[im 8/30]
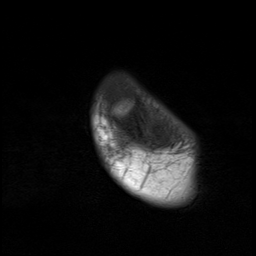
[im 15/30]
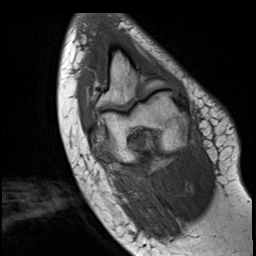
[im 22/30]
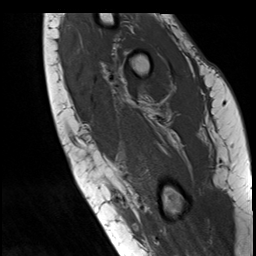
[im 30/30]
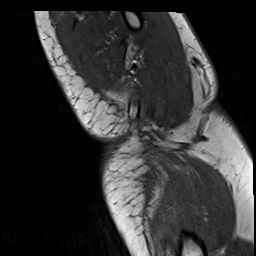

[Series 13: T2 fat-sat · sagittal · left · 3.0mm · 0.78mm/px · 4 of 27 slices shown (3 of 3)]
[im 1/27]
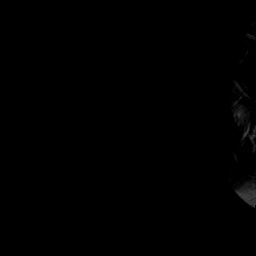
[im 9/27]
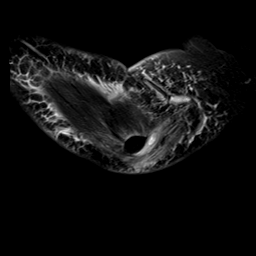
[im 18/27]
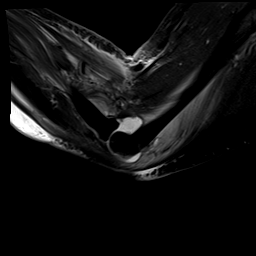
[im 27/27]
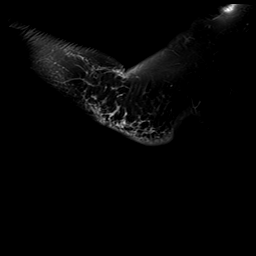

[Series 14: T1 fat-sat · coronal · non-contrast · left · 3.0mm · 0.78mm/px · 5 of 30 slices shown]
[im 1/30]
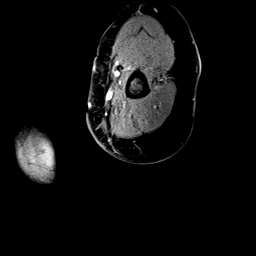
[im 8/30]
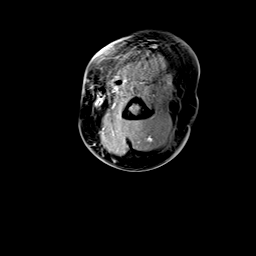
[im 15/30]
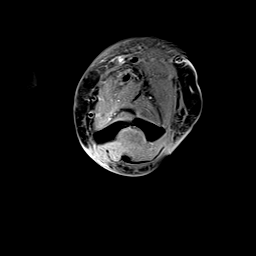
[im 22/30]
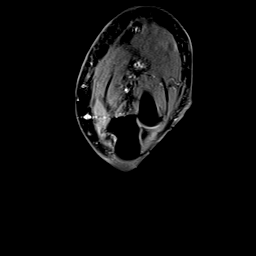
[im 30/30]
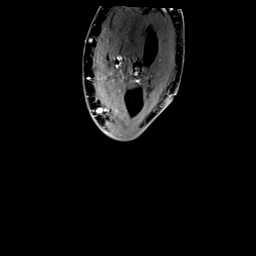

[Series 18: T1 fat-sat post-contrast · oblique · left · 3.0mm · 0.78mm/px · 5 of 30 slices shown (1 of 3)]
[im 1/30]
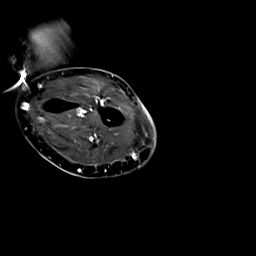
[im 8/30]
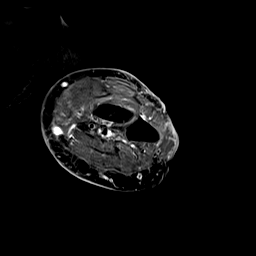
[im 15/30]
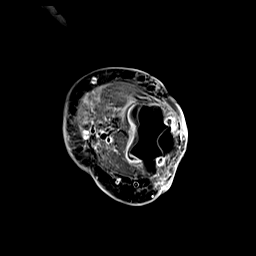
[im 22/30]
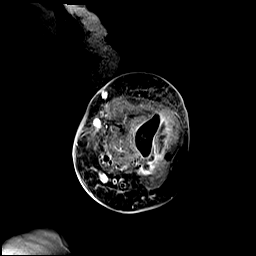
[im 30/30]
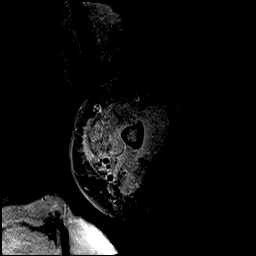

[Series 19: T1 fat-sat post-contrast · sagittal · left · 3.0mm · 0.78mm/px · 5 of 30 slices shown (2 of 3)]
[im 1/30]
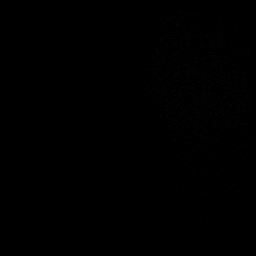
[im 8/30]
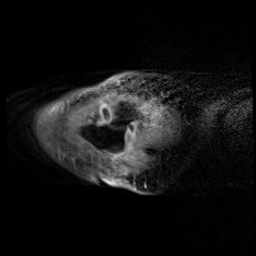
[im 15/30]
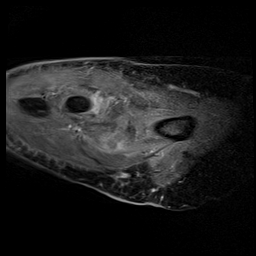
[im 22/30]
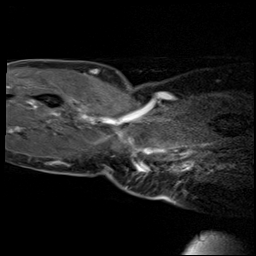
[im 30/30]
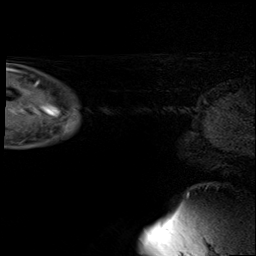

[Series 20: T1 fat-sat post-contrast · axial · left · 3.0mm · 0.78mm/px · z∈[-199,-109]mm · 4 of 25 slices shown (3 of 3)]
[im 1/25]
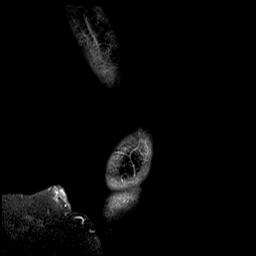
[im 9/25]
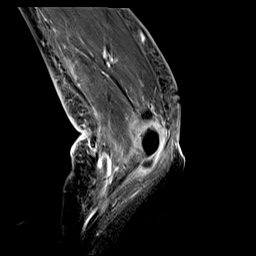
[im 17/25]
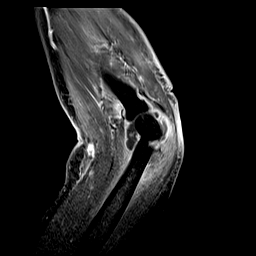
[im 25/25]
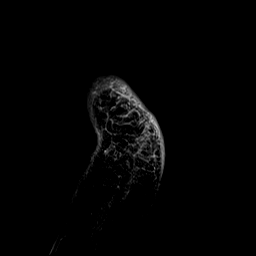

[40 of 40 positions shown; findings below may reference images not displayed]

FINDINGS: TENDONS

Common forearm flexor origin: Intact.

Common forearm extensor origin: Intact.

Biceps: Intact.

Triceps: Intact.

LIGAMENTS

Medial stabilizers: The ulnar collateral ligament is intact.

Lateral stabilizers: The lateral ulnar and radial collateral
ligaments are intact.

Cartilage: Preserved.  No focal chondral defect demonstrated.

Joint: Large joint effusion with synovial thickening and
enhancement. No intra-articular body observed.

Cubital tunnel: Unremarkable.  The ulnar nerve appears normal.

Bones: No acute or significant extra-articular osseous findings.

Other: Periarticular muscle edema, most prominently involving the
distal triceps muscle where there is a 2.1 cm rim enhancing fluid
collection within the deep muscle just superior to the posterior
elbow joint (series 13, image 14; series 20, image 15). Prominent
soft tissue swelling around the elbow.
IMPRESSION: 1. Large joint effusion with synovial thickening and enhancement,
most consistent with septic arthritis given clinical history.
2. Periarticular muscle inflammation, most prominently involving the
distal triceps muscle where there is a deep intramuscular 2.1 cm
abscess just superior to the posterior elbow joint.
3. No evidence of osteomyelitis.

## 2022-01-18 IMAGING — RF DG FLUORO GUIDE NDL PLC/BX
1 series · 1 of 1 positions shown · non-contrast
Comparison: Same day MRI.

CLINICAL DATA: Left elbow pain, swelling, effusion

EXAM:
Left elbow joint arthrocentesis under fluoroscopy

[Series 1: cp_bariatric · 0.17mm/px · 1 of 1 slices shown]
[im 1/1]
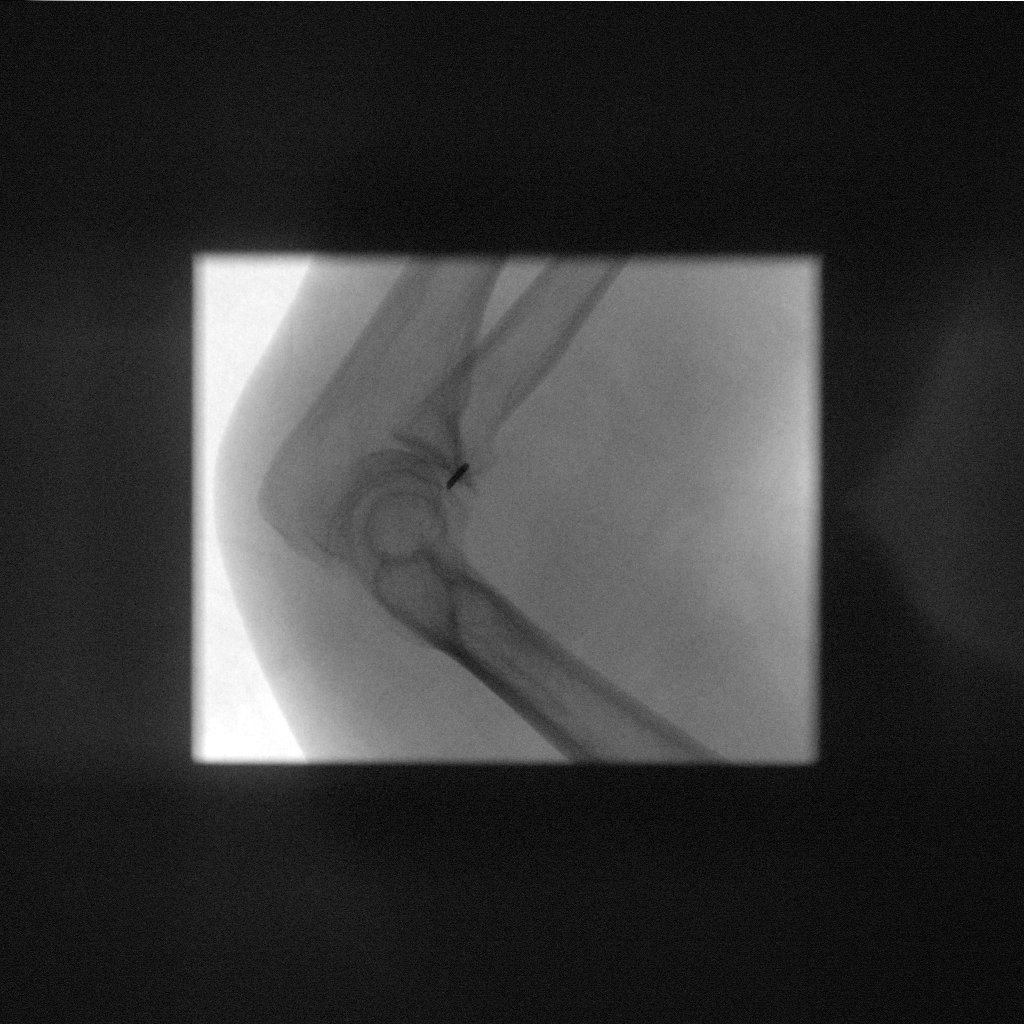

[1 of 1 positions shown; findings below may reference images not displayed]

FLUOROSCOPY:
Radiation Exposure Index (as provided by the fluoroscopic device):
1.00 mGy Kerma

PROCEDURE:
Informed verbal consent was obtained from the patient after
discussion of the risks, benefits and alternatives to treatment.

The patient was placed prone on the fluoroscopy table. Appropriate
site of procedure was identified and the skin overlying the lateral
aspect of the elbow was prepped and draped in usual sterile fashion.
The skin was infiltrated locally with 1% Lidocaine. A 18g needle was
advanced into the joint and 4cc of synovial fluid with many GA
body inclusions was aspirated. Purulence could not be excluded.
Needle was removed and site was bandaged. Patient tolerated
procedure well. No complications.
IMPRESSION: Technically successful elbow aspiration with 4cc synovial fluid
acquired.

Read and performed by: GA, PA-C

## 2022-01-18 IMAGING — MR MR CERVICAL SPINE WO/W CM
5 of 8 series · 29 of 48 positions shown · IV contrast (gadavist)
Comparison: None Available.

CLINICAL DATA: Back pain and bacteremia

EXAM:
MRI CERVICAL SPINE WITHOUT AND WITH CONTRAST
TECHNIQUE: Multiplanar and multiecho pulse sequences of the cervical spine, to
include the craniocervical junction and cervicothoracic junction,
were obtained without and with intravenous contrast.
CONTRAST:  7.5mL GADAVIST GADOBUTROL 1 MMOL/ML IV SOLN

[Series 5: T2 · sagittal · 3.0mm · 0.62mm/px · 4 of 15 slices shown (1 of 2)]
[im 1/15]
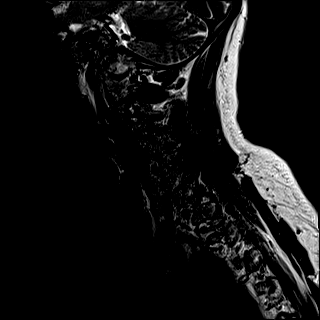
[im 5/15]
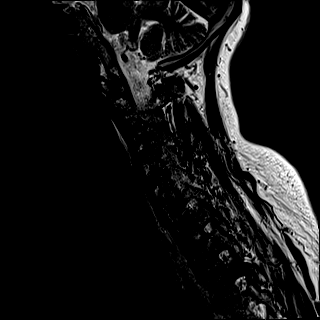
[im 10/15]
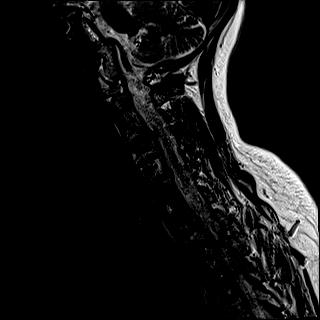
[im 15/15]
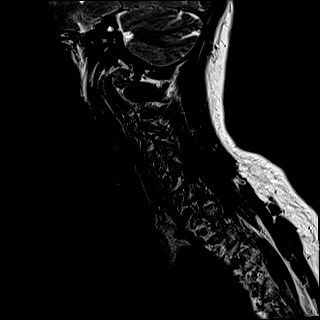

[Series 7: STIR · sagittal · 3.0mm · 0.62mm/px · 4 of 15 slices shown]
[im 1/15]
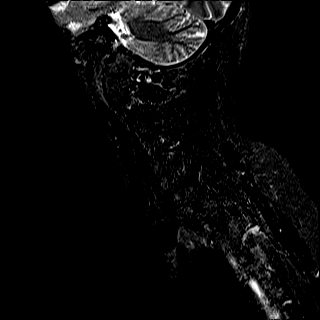
[im 5/15]
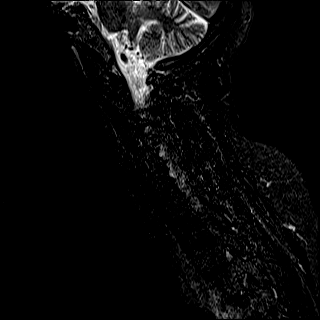
[im 10/15]
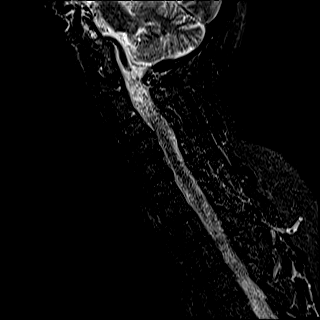
[im 15/15]
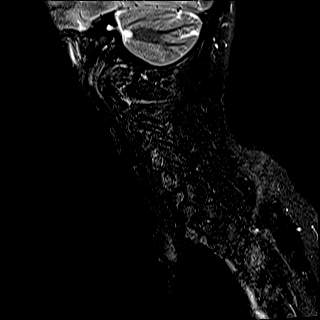

[Series 8: T2 · axial · 3.0mm · 0.70mm/px · z∈[-121,-30]mm · 8 of 29 slices shown (2 of 2)]
[im 1/29]
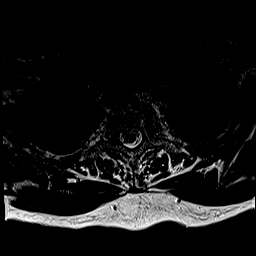
[im 5/29]
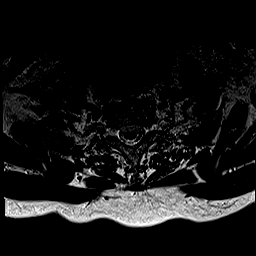
[im 9/29]
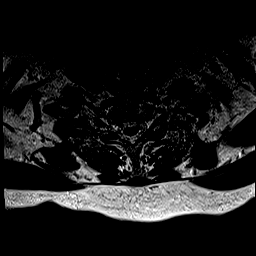
[im 13/29]
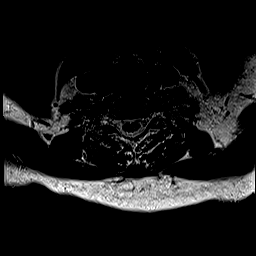
[im 17/29]
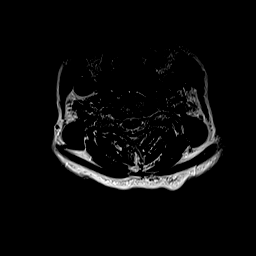
[im 21/29]
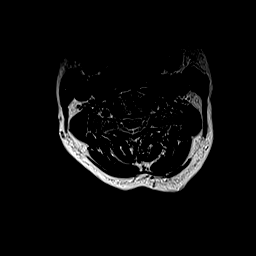
[im 25/29]
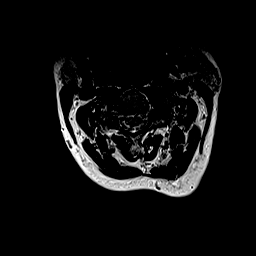
[im 29/29]
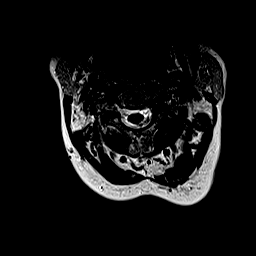

[Series 10: T1 · axial · non-contrast · 3.0mm · 0.35mm/px · z∈[-121,-30]mm · 8 of 29 slices shown]
[im 1/29]
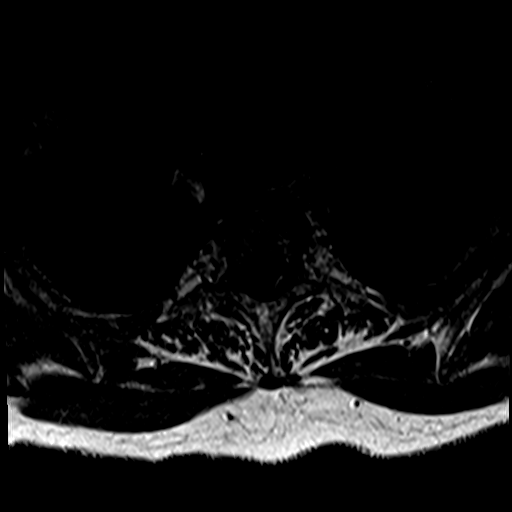
[im 5/29]
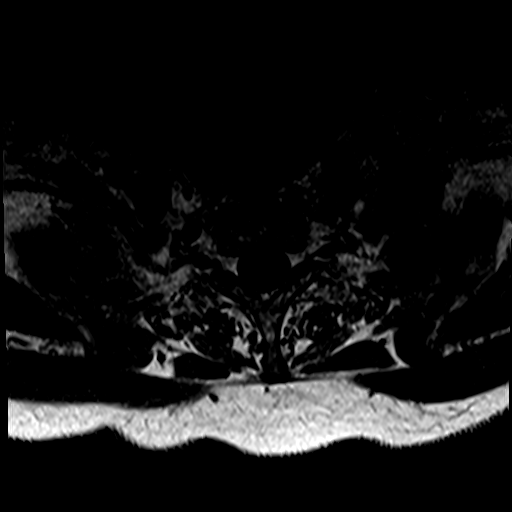
[im 9/29]
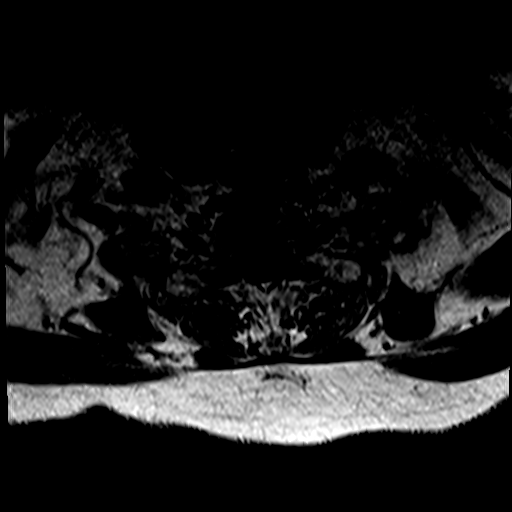
[im 13/29]
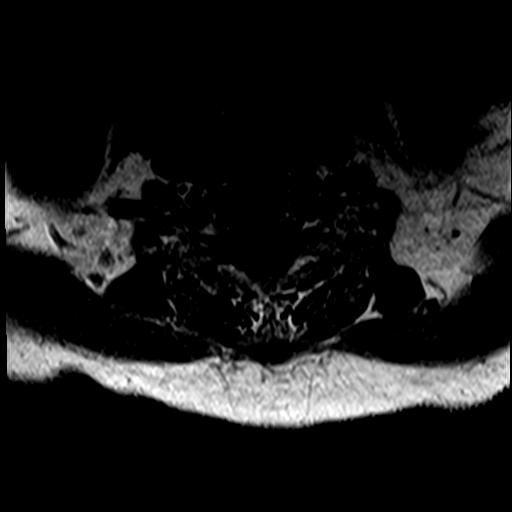
[im 17/29]
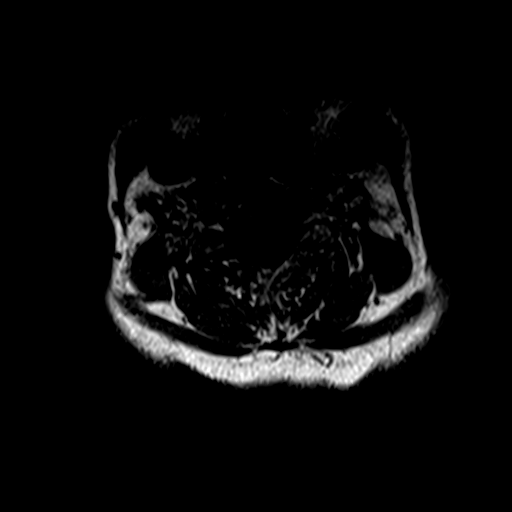
[im 21/29]
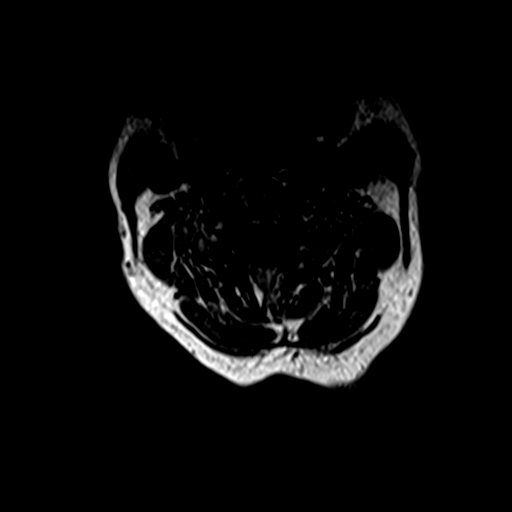
[im 25/29]
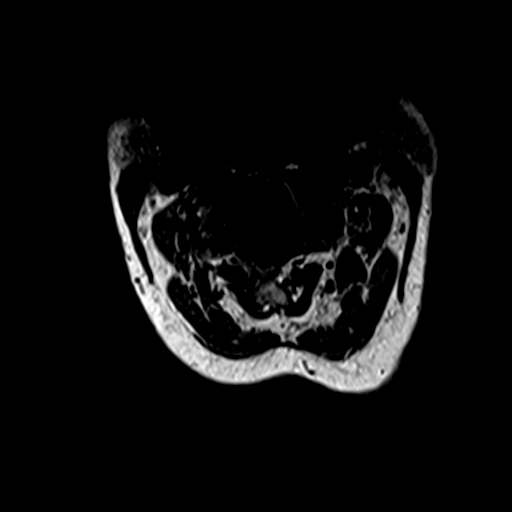
[im 29/29]
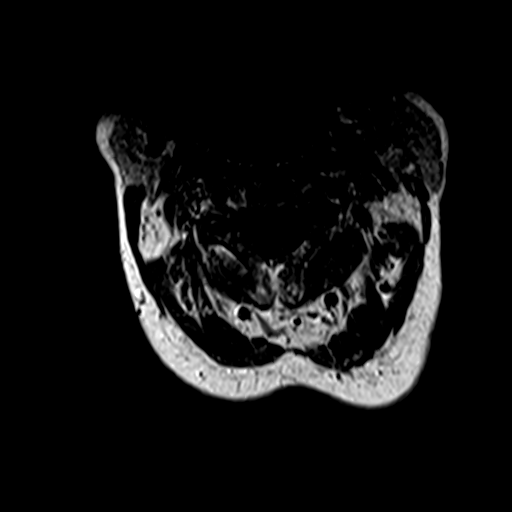

[Series 20: T1 post-contrast · axial · 3.0mm · 0.35mm/px · z∈[-110,-57]mm · 5 of 29 slices shown]
[im 1/29]
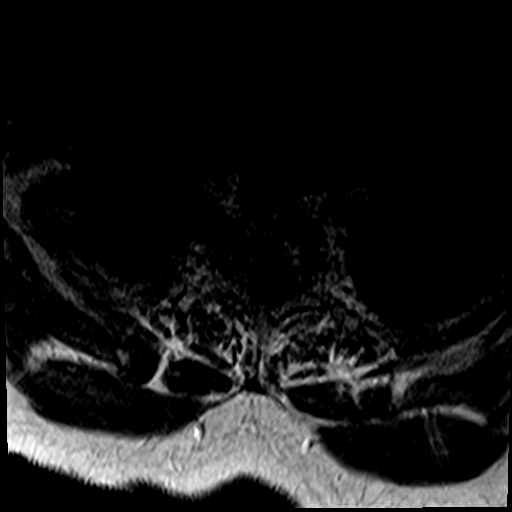
[im 5/29]
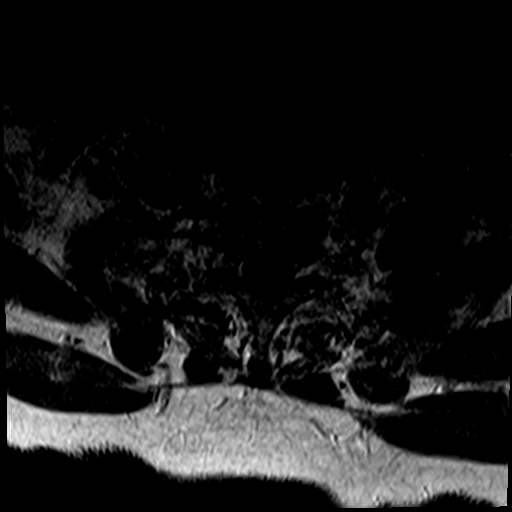
[im 9/29]
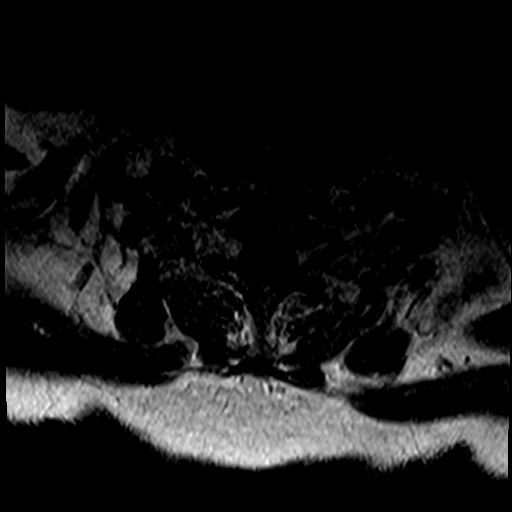
[im 13/29]
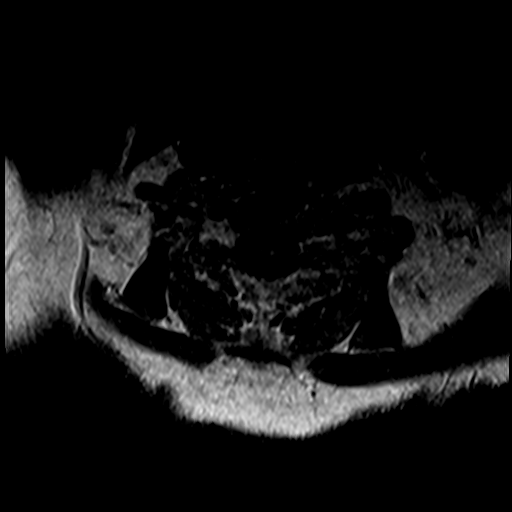
[im 17/29]
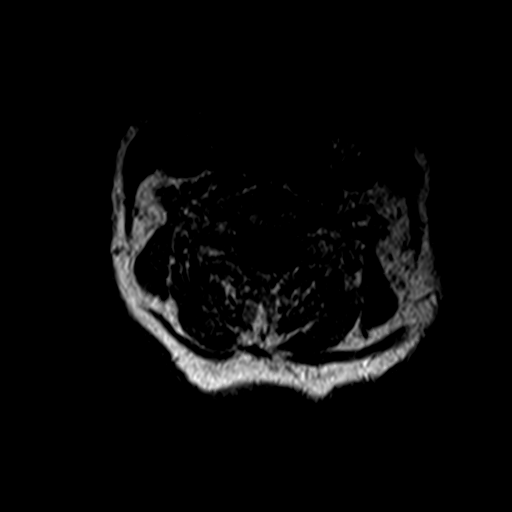

[29 of 48 positions shown; findings below may reference images not displayed]

FINDINGS: Alignment: Grade 1 anterolisthesis at C3-4.

Vertebrae: No fracture, evidence of discitis, or bone lesion.

Cord: Normal signal and morphology.  No epidural collection.

Posterior Fossa, vertebral arteries, paraspinal tissues: Negative.

Disc levels:

C1-2: Unremarkable.

C2-3: Normal disc space and facet joints. There is no spinal canal
stenosis. No neural foraminal stenosis.

C3-4: Small disc bulge and uncovertebral spurring. There is no
spinal canal stenosis. No neural foraminal stenosis.

C4-5: Normal disc space and facet joints. There is no spinal canal
stenosis. No neural foraminal stenosis.

C5-6: Normal disc space and facet joints. There is no spinal canal
stenosis. No neural foraminal stenosis.

C6-7: Normal disc space and facet joints. There is no spinal canal
stenosis. No neural foraminal stenosis.

C7-T1: Normal disc space and facet joints. There is no spinal canal
stenosis. No neural foraminal stenosis.
IMPRESSION: 1. No discitis-osteomyelitis or epidural collection.
2. Mild degenerative disc disease at C3-4. No spinal canal or neural
foraminal stenosis.

## 2022-01-18 IMAGING — MR MR LUMBAR SPINE WO/W CM
7 of 9 series · 37 of 48 positions shown · IV contrast (gadavist)
Comparison: None Available.

CLINICAL DATA: Back pain and bacteremia

EXAM:
MRI LUMBAR SPINE WITHOUT AND WITH CONTRAST
TECHNIQUE: Multiplanar and multiecho pulse sequences of the lumbar spine were
obtained without and with intravenous contrast.
CONTRAST:  7.5mL GADAVIST GADOBUTROL 1 MMOL/ML IV SOLN

[Series 9: T2 · sagittal · 4.0mm · 1.02mm/px · 5 of 17 slices shown (1 of 2)]
[im 1/17]
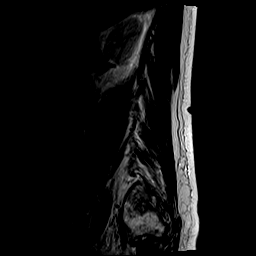
[im 5/17]
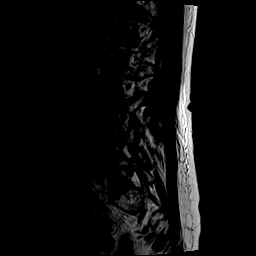
[im 9/17]
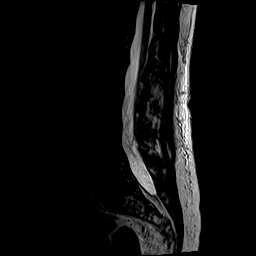
[im 13/17]
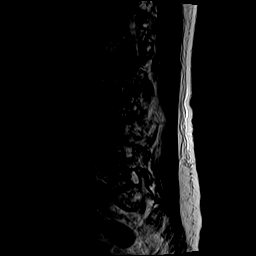
[im 17/17]
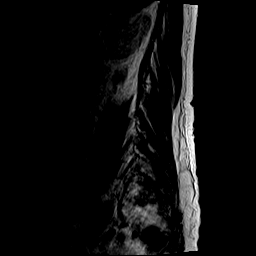

[Series 10: T1 · sagittal · 4.0mm · 1.02mm/px · 5 of 17 slices shown (1 of 3)]
[im 1/17]
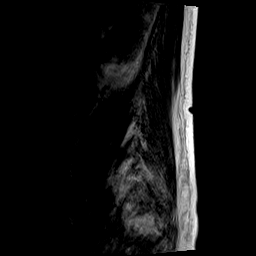
[im 5/17]
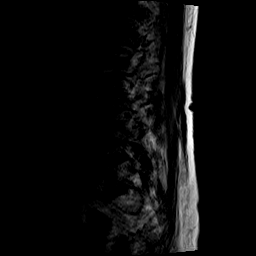
[im 9/17]
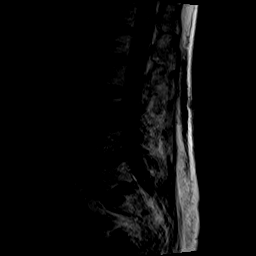
[im 13/17]
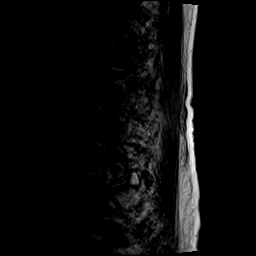
[im 17/17]
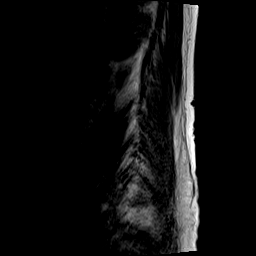

[Series 11: STIR · sagittal · 4.0mm · 0.51mm/px · 4 of 17 slices shown]
[im 1/17]
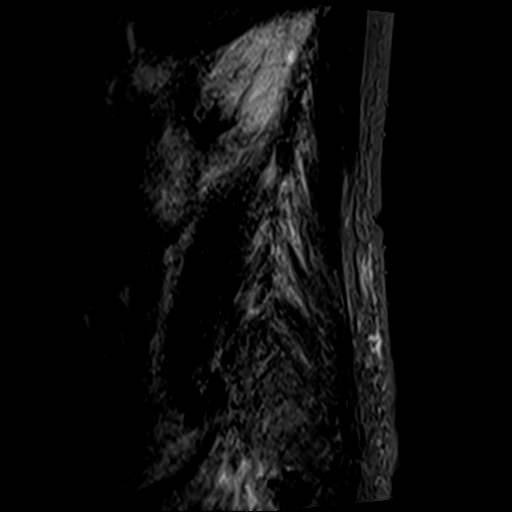
[im 6/17]
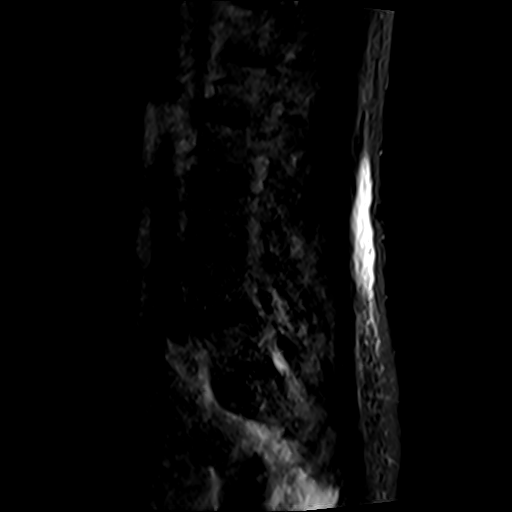
[im 11/17]
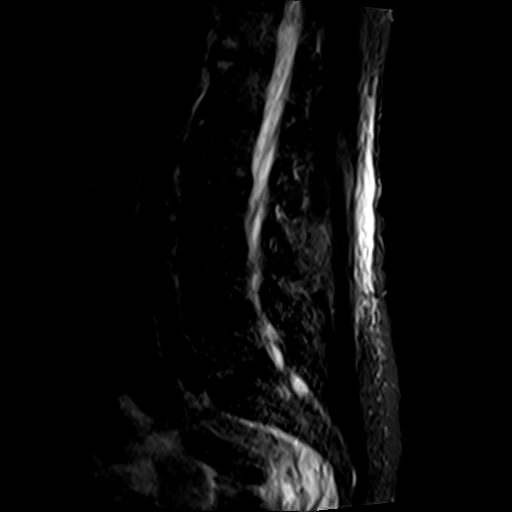
[im 17/17]
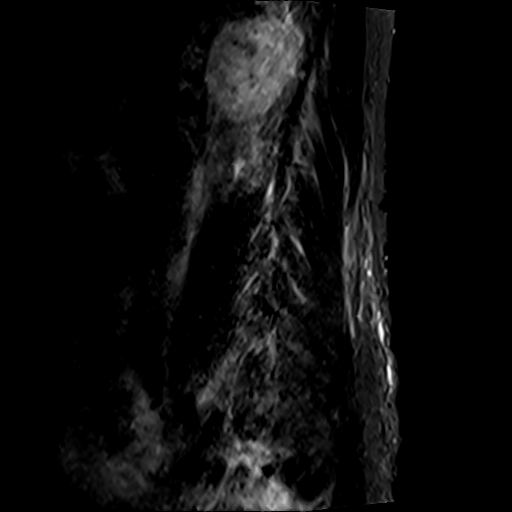

[Series 12: T2 · axial · 4.0mm · 0.78mm/px · z∈[-521,-308]mm · 8 of 36 slices shown (2 of 2)]
[im 1/36]
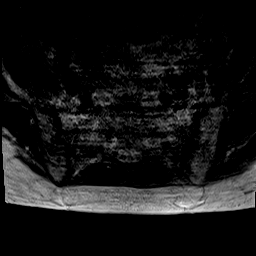
[im 6/36]
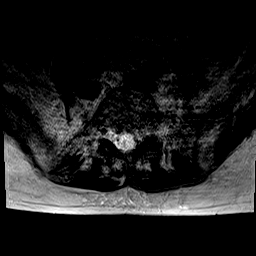
[im 11/36]
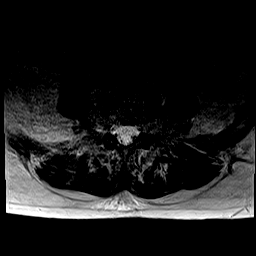
[im 16/36]
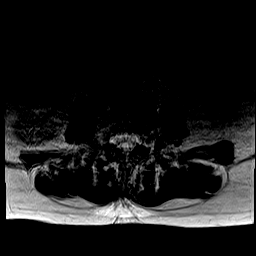
[im 21/36]
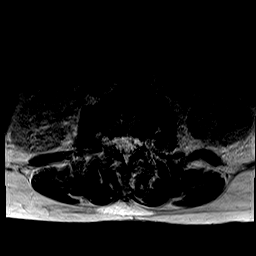
[im 26/36]
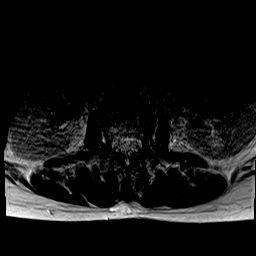
[im 31/36]
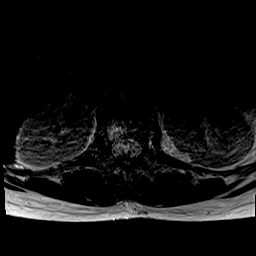
[im 36/36]
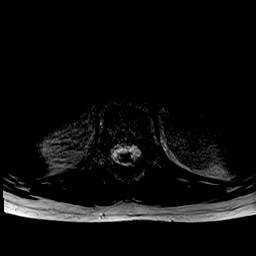

[Series 14: T1 · sagittal · 4.0mm · 1.02mm/px · 3 of 14 slices shown (2 of 3)]
[im 1/14]
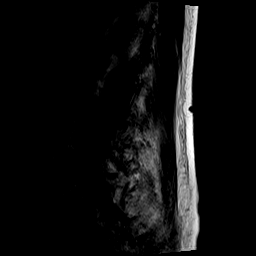
[im 7/14]
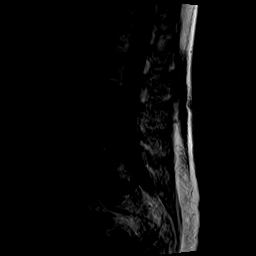
[im 14/14]
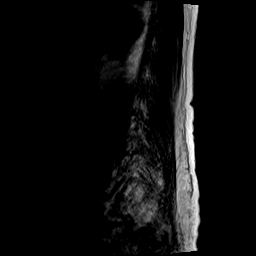

[Series 15: T1 · axial · 4.0mm · 0.39mm/px · z∈[-521,-308]mm · 8 of 36 slices shown (3 of 3)]
[im 1/36]
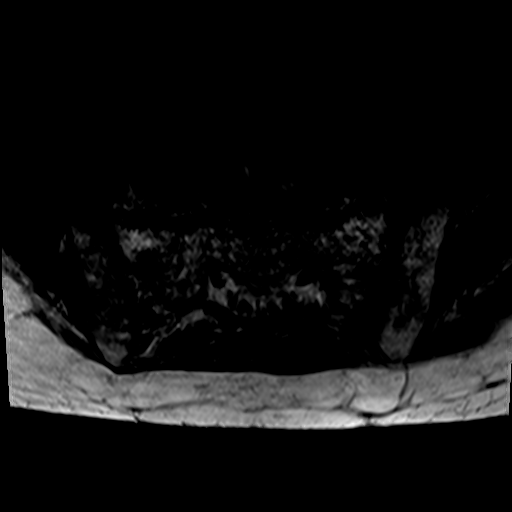
[im 6/36]
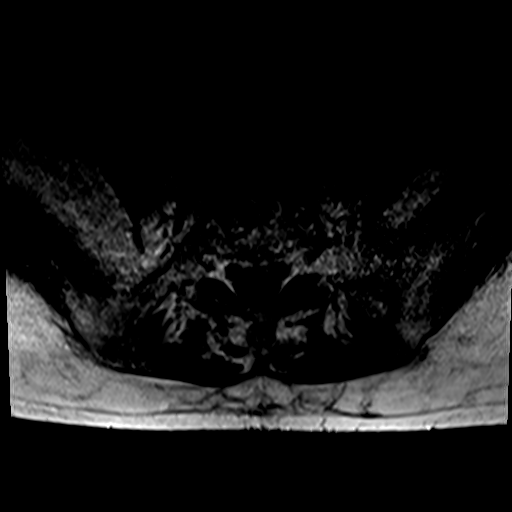
[im 11/36]
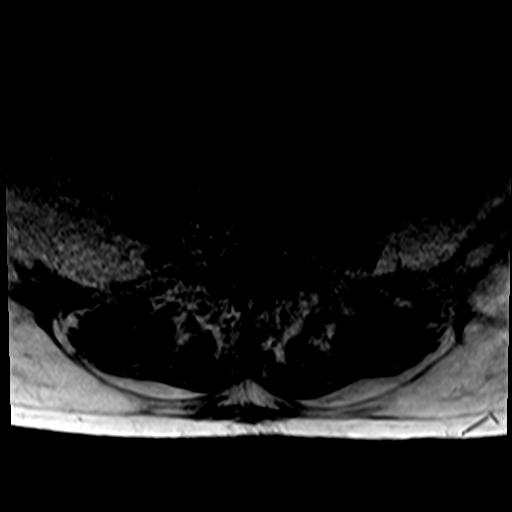
[im 16/36]
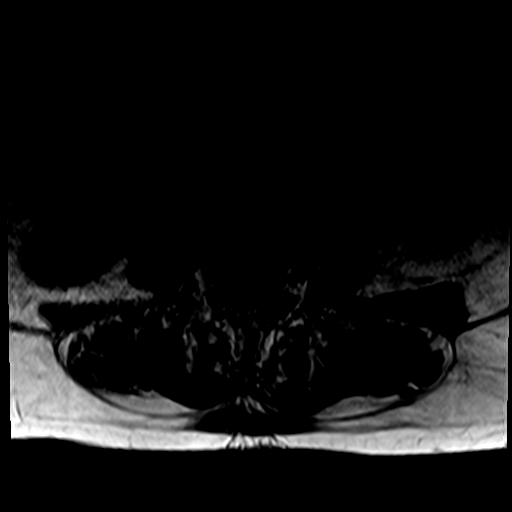
[im 21/36]
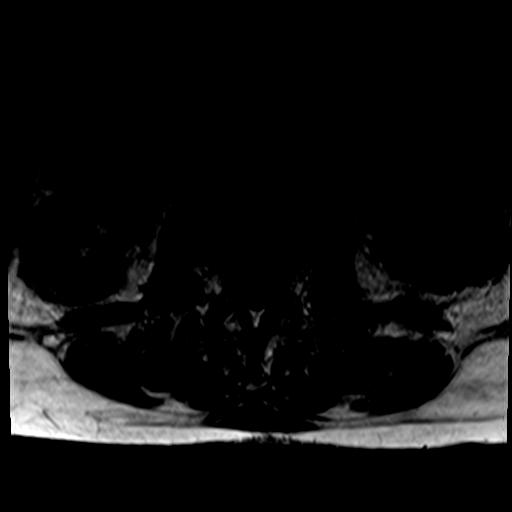
[im 26/36]
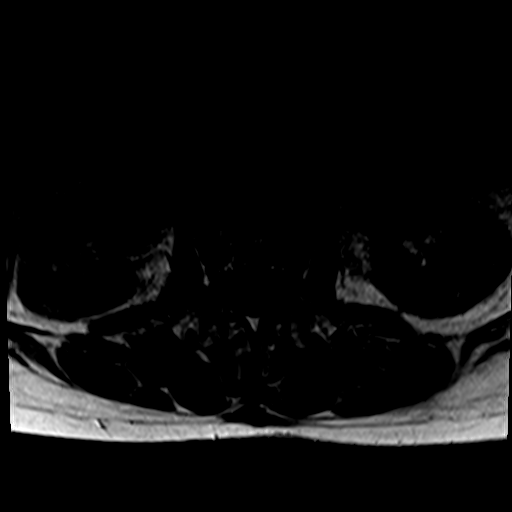
[im 31/36]
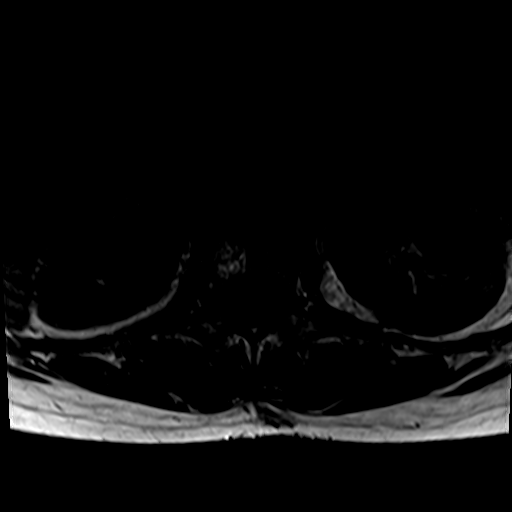
[im 36/36]
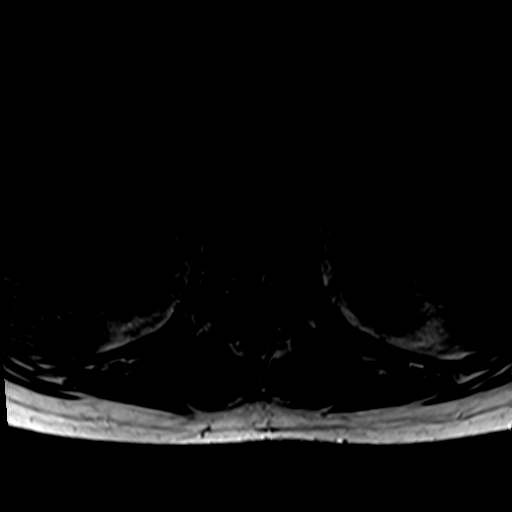

[Series 24: T1 fat-sat post-contrast · sagittal · 4.0mm · 1.02mm/px · 4 of 17 slices shown]
[im 1/17]
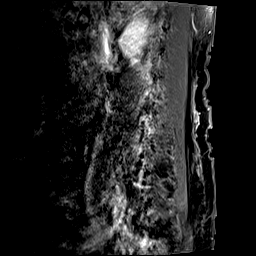
[im 6/17]
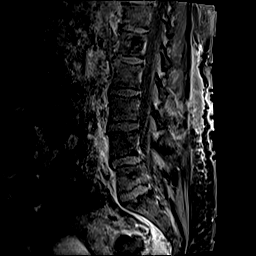
[im 11/17]
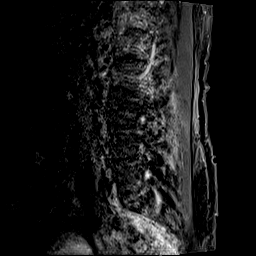
[im 17/17]
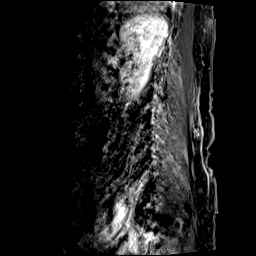

[37 of 48 positions shown; findings below may reference images not displayed]

FINDINGS: The examination is degraded by motion.

Segmentation: Standard

Alignment:  Normal

Vertebrae: No fracture, evidence of discitis, or bone lesion. No
epidural collection

Conus medullaris and cauda equina: Conus extends to the L1 level.
Conus and cauda equina appear normal.

Paraspinal and other soft tissues: No psoas abscess

Disc levels:

Motion degraded axial images but no spinal canal stenosis. Neural
foramina are patent.
IMPRESSION: 1. Motion degraded examination.
2. No epidural collection or discitis/osteomyelitis.
3. No spinal canal or neural foraminal stenosis.

## 2022-01-18 MED ORDER — POTASSIUM CHLORIDE CRYS ER 20 MEQ PO TBCR
40.0000 meq | EXTENDED_RELEASE_TABLET | ORAL | Status: AC
  Administered 2022-01-18 (×2): 40 meq via ORAL
  Filled 2022-01-18 (×2): qty 2

## 2022-01-18 MED ORDER — GADOBUTROL 1 MMOL/ML IV SOLN
7.5000 mL | Freq: Once | INTRAVENOUS | Status: AC | PRN
  Administered 2022-01-18: 7.5 mL via INTRAVENOUS

## 2022-01-18 MED ORDER — LIDOCAINE HCL (PF) 1 % IJ SOLN
10.0000 mL | Freq: Once | INTRAMUSCULAR | Status: AC
  Administered 2022-01-18: 5 mL

## 2022-01-18 MED ORDER — PSYLLIUM 95 % PO PACK
1.0000 | PACK | Freq: Two times a day (BID) | ORAL | Status: DC | PRN
Start: 2022-01-18 — End: 2022-01-22
  Administered 2022-01-18 – 2022-01-21 (×4): 1 via ORAL
  Filled 2022-01-18 (×5): qty 1

## 2022-01-18 NOTE — Evaluation (Signed)
Physical Therapy Evaluation Patient Details Name: Molly Krueger MRN: [id number] DOB: 09-23-1960 Today's Date: 01/18/2022  History of Present Illness  Molly Krueger is a 61yoF who comes [facility] on [facility] due to weakness, myalgias, dehydration, admitted under sepsis criteria. Covid and Flu negative. Ortho consult placed due to 3d prgressive Lt elbow stiffness, effusion, and pain. ID following due to identified bactremia. ID recommending MRI of elbow and spine, no acute findings of spine, elbow showing an ascess of the triceps, signs of joint sepsis.  Clinical Impression  Pt admitted with above diagnosis. Pt currently with functional limitations due to the deficits listed below (see "PT Problem List"). Upon entry, pt in bed, awake and agreeable to participate. The pt is alert, pleasant, interactive, and able to provide info regarding prior level of function, both in tolerance and independence. Patient's performance this date reveals decreased ability, independence, and tolerance in performing all basic mobility required for performance of activities of daily living. Pt is able to perform bed mobility, transfers, standing balance, and household distance AMB without any physical assistance or LOB, but does appear fatigued and weak at times. Husband can facilitate IV pole safety when AMB in room and in hallway. Patient is at baseline level of independence in mobility, all education completed, and time is given to address all questions/concerns. No additional skilled PT services needed at this time, PT signing off. PT recommends daily ambulation ad lib or with nursing staff as needed to prevent deconditioning.        Recommendations for follow up therapy are one component of a multi-disciplinary discharge planning process, led by the attending physician.  Recommendations may be updated based on patient status, additional functional criteria and insurance authorization.  Follow Up Recommendations No PT  follow up      Assistance Recommended at Discharge Set up Supervision/Assistance  Patient can return home with the following       Equipment Recommendations None recommended by PT  Recommendations for Other Services       Functional Status Assessment Patient has had a recent decline in their functional status and demonstrates the ability to make significant improvements in function in a reasonable and predictable amount of time.     Precautions / Restrictions Precautions Precautions: None Restrictions Weight Bearing Restrictions: No      Mobility  Bed Mobility Overal bed mobility: Modified Independent                  Transfers Overall transfer level: Modified independent                      Ambulation/Gait Ambulation/Gait assistance: Modified independent (Device/Increase time) Gait Distance (Feet): 280 Feet Assistive device: None Gait Pattern/deviations: WFL(Within Functional Limits) Gait velocity: 0.49m/s        Stairs            Wheelchair Mobility    Modified Rankin (Stroke Patients Only)       Balance Overall balance assessment: Independent, No apparent balance deficits (not formally assessed)                                           Pertinent Vitals/Pain Pain Assessment Pain Assessment: No/denies pain    Home Living Family/patient expects to be discharged to:: Private residence Living Arrangements: Spouse/significant other Available Help at Discharge: Family Type of Home: House Home Access: Stairs  to enter Entrance Stairs-Rails: Can reach both;Right;Left Entrance Stairs-Number of Steps: 4   Home Layout: One level Home Equipment: None      Prior Function Prior Level of Function : Working/employed;Driving             Mobility Comments: inde ADLs Comments: inde     Hand Dominance        Extremity/Trunk Assessment   Upper Extremity Assessment Upper Extremity Assessment: Overall WFL  for tasks assessed    Lower Extremity Assessment Lower Extremity Assessment: Overall WFL for tasks assessed       Communication      Cognition Arousal/Alertness: Awake/alert Behavior During Therapy: WFL for tasks assessed/performed Overall Cognitive Status: Within Functional Limits for tasks assessed                                          General Comments      Exercises     Assessment/Plan    PT Assessment Patient does not need any further PT services;All further PT needs can be met in the next venue of care  PT Problem List Decreased activity tolerance;Decreased mobility       PT Treatment Interventions      PT Goals (Current goals can be found in the Care Plan section)  Acute Rehab PT Goals PT Goal Formulation: All assessment and education complete, DC therapy    Frequency       Co-evaluation               AM-PAC PT "6 Clicks" Mobility  Outcome Measure Help needed turning from your back to your side while in a flat bed without using bedrails?: None Help needed moving from lying on your back to sitting on the side of a flat bed without using bedrails?: None Help needed moving to and from a bed to a chair (including a wheelchair)?: None Help needed standing up from a chair using your arms (e.g., wheelchair or bedside chair)?: None Help needed to walk in hospital room?: A Little Help needed climbing 3-5 steps with a railing? : A Little 6 Click Score: 22    End of Session   Activity Tolerance: Patient tolerated treatment well;No increased pain Patient left: in chair;with family/visitor present Nurse Communication: Mobility status PT Visit Diagnosis: Muscle weakness (generalized) (M62.81);Difficulty in walking, not elsewhere classified (R26.2)    Time: 0981-1914 PT Time Calculation (min) (ACUTE ONLY): 31 min   Charges:   PT Evaluation $PT Eval Moderate Complexity: 1 Mod PT Treatments $Therapeutic Activity: 8-22 mins        10:43 AM, 01/18/22 Rosamaria Lints, PT, DPT Physical Therapist - [facility]  [phone] (ASCOM)     Molly Krueger 01/18/2022, 10:40 AM

## 2022-01-19 DIAGNOSIS — B955 Unspecified streptococcus as the cause of diseases classified elsewhere: Secondary | ICD-10-CM

## 2022-01-19 DIAGNOSIS — R7881 Bacteremia: Secondary | ICD-10-CM

## 2022-01-19 LAB — CBC
HCT: 28.7 % — ABNORMAL LOW (ref 36.0–46.0)
Hemoglobin: 9.6 g/dL — ABNORMAL LOW (ref 12.0–15.0)
MCH: 32.5 pg (ref 26.0–34.0)
MCHC: 33.4 g/dL (ref 30.0–36.0)
MCV: 97.3 fL (ref 80.0–100.0)
Platelets: 83 10*3/uL — ABNORMAL LOW (ref 150–400)
RBC: 2.95 MIL/uL — ABNORMAL LOW (ref 3.87–5.11)
RDW: 13.9 % (ref 11.5–15.5)
WBC: 5 10*3/uL (ref 4.0–10.5)
nRBC: 0 % (ref 0.0–0.2)

## 2022-01-19 LAB — CULTURE, BLOOD (ROUTINE X 2)

## 2022-01-19 LAB — BASIC METABOLIC PANEL
Anion gap: 5 (ref 5–15)
BUN: 11 mg/dL (ref 8–23)
CO2: 25 mmol/L (ref 22–32)
Calcium: 8.4 mg/dL — ABNORMAL LOW (ref 8.9–10.3)
Chloride: 107 mmol/L (ref 98–111)
Creatinine, Ser: 0.62 mg/dL (ref 0.44–1.00)
GFR, Estimated: >60 mL/min (ref >60)
Glucose, Bld: 119 mg/dL — ABNORMAL HIGH (ref 70–99)
Potassium: 3.7 mmol/L (ref 3.5–5.1)
Sodium: 137 mmol/L (ref 135–145)

## 2022-01-19 LAB — MAGNESIUM: Magnesium: 2.2 mg/dL (ref 1.7–2.4)

## 2022-01-19 LAB — C-REACTIVE PROTEIN: CRP: 17.8 mg/dL — ABNORMAL HIGH (ref <1.0)

## 2022-01-19 LAB — SEDIMENTATION RATE: Sed Rate: 108 mm/hr — ABNORMAL HIGH (ref 0–30)

## 2022-01-19 LAB — GLUCOSE, CAPILLARY: Glucose-Capillary: 111 mg/dL — ABNORMAL HIGH (ref 70–99)

## 2022-01-19 NOTE — Progress Notes (Signed)
  Progress Note   Patient: Molly Krueger ZOX:[id number] DOB: Dec 26, 1960 DOA: 01/16/2022     3 DOS: the patient was seen and examined on 01/19/2022   Brief hospital course: No notes on file  Assessment and Plan: * Streptococcal bacteremia --1 of 2 blood cx pos strep group G.  1 of 2 pos GPR BACILLUS SPECIES.  2nd set obtained. --ID consulted --MRI spine neg.  Left elbow likely septic joint. Plan: --cont ceftriaxone for now  Sepsis [facility]) Patient met sepsis criteria with fever and tachycardia.  Source bacteremia. Received IV fluid and broad-spectrum antibiotics per sepsis protocol in ED. --Chest imaging with some concern of inflammatory changes versus pneumonia, no no respiratory symptoms.   AKI (acute kidney injury) (HCC) Most likely secondary to poor p.o. intake and dehydration.  Creatinine at 1.21 on presentation.  Improved to 0.61 the next day.  Hyponatremia Mild hyponatremia with sodium of 129, most likely secondary to poor p.o. intake as she appears dry. --improved with IVF.  Hypokalemia --monitor and replete PRN  Septic joint of left elbow (HCC) --on presentation, ortho attempted needle aspiration of joint fluid, but no return --MRI elbow showed large joint effusion and Periarticular muscle inflammation, most prominently involving the distal triceps muscle where there is a deep intramuscular 2.1 cm abscess just superior to the posterior elbow joint. --IR aspirated joint (on day 3 of abx), sample clotted so cell count couldn't be performed.   Plan: --ortho re-eval today, found septic joint likely --surgical I/D tomorrow        Subjective:  Pt complained of dyspnea on exertion this morning, which is new for her.  Left elbow continues to be swollen, and swelling has extended to left hand and wrist.    Ortho re-evaluated pt and found likely septic left elbow joint.     Physical Exam:  Constitutional: NAD, AAOx3 HEENT: conjunctivae and lids normal, EOMI CV:  No cyanosis.   RESP: normal respiratory effort, on RA Extremities: swelling from left elbow down to left hand, with mild erythema around left elbow SKIN: warm, dry Neuro: II - XII grossly intact.   Psych: Normal mood and affect.  Appropriate judgement and reason   Data Reviewed:  Family Communication:   Disposition: Status is: Inpatient   Planned Discharge Destination: Home    Time spent: 50 minutes  Author: Darlin Priestly, MD 01/19/2022 5:07 PM  For on call review [email or web address].

## 2022-01-20 ENCOUNTER — Inpatient Hospital Stay: Payer: BC Managed Care – PPO | Admitting: Anesthesiology

## 2022-01-20 ENCOUNTER — Encounter
Admission: EM | Disposition: A | Discharge status: Home / Self Care | Payer: Self-pay | Source: Home / Self Care | Attending: Hospitalist

## 2022-01-20 ENCOUNTER — Other Ambulatory Visit: Payer: Self-pay

## 2022-01-20 DIAGNOSIS — B955 Unspecified streptococcus as the cause of diseases classified elsewhere: Secondary | ICD-10-CM | POA: Diagnosis not present

## 2022-01-20 DIAGNOSIS — R7881 Bacteremia: Secondary | ICD-10-CM | POA: Diagnosis not present

## 2022-01-20 HISTORY — PX: I & D EXTREMITY: SHX5045

## 2022-01-20 LAB — BASIC METABOLIC PANEL
Anion gap: 6 (ref 5–15)
BUN: 11 mg/dL (ref 8–23)
CO2: 26 mmol/L (ref 22–32)
Calcium: 8.5 mg/dL — ABNORMAL LOW (ref 8.9–10.3)
Chloride: 108 mmol/L (ref 98–111)
Creatinine, Ser: 0.59 mg/dL (ref 0.44–1.00)
GFR, Estimated: >60 mL/min (ref >60)
Glucose, Bld: 121 mg/dL — ABNORMAL HIGH (ref 70–99)
Potassium: 3.4 mmol/L — ABNORMAL LOW (ref 3.5–5.1)
Sodium: 140 mmol/L (ref 135–145)

## 2022-01-20 LAB — CBC
HCT: 29.7 % — ABNORMAL LOW (ref 36.0–46.0)
Hemoglobin: 9.9 g/dL — ABNORMAL LOW (ref 12.0–15.0)
MCH: 32.9 pg (ref 26.0–34.0)
MCHC: 33.3 g/dL (ref 30.0–36.0)
MCV: 98.7 fL (ref 80.0–100.0)
Platelets: 91 10*3/uL — ABNORMAL LOW (ref 150–400)
RBC: 3.01 MIL/uL — ABNORMAL LOW (ref 3.87–5.11)
RDW: 13.6 % (ref 11.5–15.5)
WBC: 5.3 10*3/uL (ref 4.0–10.5)
nRBC: 0 % (ref 0.0–0.2)

## 2022-01-20 LAB — MAGNESIUM: Magnesium: 2.2 mg/dL (ref 1.7–2.4)

## 2022-01-20 SURGERY — IRRIGATION AND DEBRIDEMENT EXTREMITY
Anesthesia: General | Site: Elbow | Laterality: Left

## 2022-01-20 MED ORDER — OXYCODONE HCL 5 MG/5ML PO SOLN: 5.0000 mg | Freq: Once | ORAL | Status: AC | PRN

## 2022-01-20 MED ORDER — DIPHENHYDRAMINE HCL 12.5 MG/5ML PO ELIX: 12.5000 mg | ORAL_SOLUTION | ORAL | Status: DC | PRN

## 2022-01-20 MED ORDER — OXYCODONE HCL 5 MG PO TABS
ORAL_TABLET | ORAL | Status: AC
  Filled 2022-01-20: qty 1

## 2022-01-20 MED ORDER — DOCUSATE SODIUM 100 MG PO CAPS
100.0000 mg | ORAL_CAPSULE | Freq: Two times a day (BID) | ORAL | Status: DC
  Administered 2022-01-20 – 2022-01-22 (×5): 100 mg via ORAL
  Filled 2022-01-20 (×5): qty 1

## 2022-01-20 MED ORDER — ONDANSETRON HCL 4 MG/2ML IJ SOLN: 4.0000 mg | Freq: Four times a day (QID) | INTRAMUSCULAR | Status: DC | PRN

## 2022-01-20 MED ORDER — FENTANYL CITRATE (PF) 100 MCG/2ML IJ SOLN
25.0000 ug | INTRAMUSCULAR | Status: DC | PRN
  Administered 2022-01-20: 50 ug via INTRAVENOUS

## 2022-01-20 MED ORDER — PHENYLEPHRINE 80 MCG/ML (10ML) SYRINGE FOR IV PUSH (FOR BLOOD PRESSURE SUPPORT)
PREFILLED_SYRINGE | INTRAVENOUS | Status: DC | PRN
  Administered 2022-01-20 (×4): 160 ug via INTRAVENOUS

## 2022-01-20 MED ORDER — DEXAMETHASONE SODIUM PHOSPHATE 10 MG/ML IJ SOLN
INTRAMUSCULAR | Status: AC
  Filled 2022-01-20: qty 1

## 2022-01-20 MED ORDER — SEVOFLURANE IN SOLN
RESPIRATORY_TRACT | Status: AC
  Filled 2022-01-20: qty 250

## 2022-01-20 MED ORDER — LIDOCAINE HCL (PF) 2 % IJ SOLN
INTRAMUSCULAR | Status: AC
  Filled 2022-01-20: qty 5

## 2022-01-20 MED ORDER — ACETAMINOPHEN 10 MG/ML IV SOLN
INTRAVENOUS | Status: AC
  Filled 2022-01-20: qty 100

## 2022-01-20 MED ORDER — FLEET ENEMA 7-19 GM/118ML RE ENEM: 1.0000 | ENEMA | Freq: Once | RECTAL | Status: DC | PRN

## 2022-01-20 MED ORDER — POTASSIUM CHLORIDE CRYS ER 20 MEQ PO TBCR
40.0000 meq | EXTENDED_RELEASE_TABLET | Freq: Once | ORAL | Status: AC
  Administered 2022-01-20: 40 meq via ORAL
  Filled 2022-01-20: qty 2

## 2022-01-20 MED ORDER — MORPHINE SULFATE (PF) 2 MG/ML IV SOLN: 0.5000 mg | INTRAVENOUS | Status: DC | PRN

## 2022-01-20 MED ORDER — ONDANSETRON HCL 4 MG PO TABS: 4.0000 mg | ORAL_TABLET | Freq: Four times a day (QID) | ORAL | Status: DC | PRN

## 2022-01-20 MED ORDER — CEFAZOLIN SODIUM-DEXTROSE 2-4 GM/100ML-% IV SOLN
2.0000 g | INTRAVENOUS | Status: AC
  Administered 2022-01-20: 2 g via INTRAVENOUS

## 2022-01-20 MED ORDER — SODIUM CHLORIDE 0.9 % IV SOLN: INTRAVENOUS | Status: DC

## 2022-01-20 MED ORDER — METOCLOPRAMIDE HCL 5 MG PO TABS: 5.0000 mg | ORAL_TABLET | Freq: Three times a day (TID) | ORAL | Status: DC | PRN

## 2022-01-20 MED ORDER — MIDAZOLAM HCL 2 MG/2ML IJ SOLN
INTRAMUSCULAR | Status: AC
  Filled 2022-01-20: qty 2

## 2022-01-20 MED ORDER — 0.9 % SODIUM CHLORIDE (POUR BTL) OPTIME
TOPICAL | Status: DC | PRN
  Administered 2022-01-20: 500 mL

## 2022-01-20 MED ORDER — OXYCODONE HCL 5 MG PO TABS
5.0000 mg | ORAL_TABLET | Freq: Once | ORAL | Status: AC | PRN
  Administered 2022-01-20: 5 mg via ORAL

## 2022-01-20 MED ORDER — LACTATED RINGERS IV SOLN: INTRAVENOUS | Status: DC | PRN

## 2022-01-20 MED ORDER — BISACODYL 10 MG RE SUPP: 10.0000 mg | Freq: Every day | RECTAL | Status: DC | PRN

## 2022-01-20 MED ORDER — CEFAZOLIN SODIUM-DEXTROSE 2-4 GM/100ML-% IV SOLN
INTRAVENOUS | Status: AC
  Filled 2022-01-20: qty 100

## 2022-01-20 MED ORDER — ACETAMINOPHEN 325 MG PO TABS
325.0000 mg | ORAL_TABLET | Freq: Four times a day (QID) | ORAL | Status: DC | PRN
  Administered 2022-01-21 – 2022-01-22 (×2): 650 mg via ORAL
  Filled 2022-01-20 (×2): qty 2

## 2022-01-20 MED ORDER — FENTANYL CITRATE (PF) 100 MCG/2ML IJ SOLN
INTRAMUSCULAR | Status: DC | PRN
  Administered 2022-01-20 (×2): 50 ug via INTRAVENOUS

## 2022-01-20 MED ORDER — LIDOCAINE HCL (CARDIAC) PF 100 MG/5ML IV SOSY
PREFILLED_SYRINGE | INTRAVENOUS | Status: DC | PRN
  Administered 2022-01-20: 80 mg via INTRAVENOUS

## 2022-01-20 MED ORDER — NEOMYCIN-POLYMYXIN B GU 40-200000 IR SOLN
Status: DC | PRN
  Administered 2022-01-20: 24 mL

## 2022-01-20 MED ORDER — SODIUM CHLORIDE 0.9 % IR SOLN
Status: DC | PRN
  Administered 2022-01-20 (×2): 3000 mL

## 2022-01-20 MED ORDER — MAGNESIUM HYDROXIDE 400 MG/5ML PO SUSP: 30.0000 mL | Freq: Every day | ORAL | Status: DC | PRN

## 2022-01-20 MED ORDER — ONDANSETRON HCL 4 MG/2ML IJ SOLN
INTRAMUSCULAR | Status: DC | PRN
  Administered 2022-01-20: 4 mg via INTRAVENOUS

## 2022-01-20 MED ORDER — HYDROMORPHONE HCL 1 MG/ML IJ SOLN
INTRAMUSCULAR | Status: AC
  Filled 2022-01-20: qty 1

## 2022-01-20 MED ORDER — PHENYLEPHRINE 80 MCG/ML (10ML) SYRINGE FOR IV PUSH (FOR BLOOD PRESSURE SUPPORT)
PREFILLED_SYRINGE | INTRAVENOUS | Status: AC
  Filled 2022-01-20: qty 10

## 2022-01-20 MED ORDER — ACETAMINOPHEN 10 MG/ML IV SOLN
INTRAVENOUS | Status: DC | PRN
  Administered 2022-01-20: 1000 mg via INTRAVENOUS

## 2022-01-20 MED ORDER — PROPOFOL 10 MG/ML IV BOLUS
INTRAVENOUS | Status: AC
  Filled 2022-01-20: qty 40

## 2022-01-20 MED ORDER — FENTANYL CITRATE (PF) 100 MCG/2ML IJ SOLN
INTRAMUSCULAR | Status: AC
  Filled 2022-01-20: qty 2

## 2022-01-20 MED ORDER — MIDAZOLAM HCL 2 MG/2ML IJ SOLN
INTRAMUSCULAR | Status: DC | PRN
  Administered 2022-01-20: 2 mg via INTRAVENOUS

## 2022-01-20 MED ORDER — HYDROCODONE-ACETAMINOPHEN 5-325 MG PO TABS
1.0000 | ORAL_TABLET | ORAL | Status: DC | PRN
  Administered 2022-01-20 – 2022-01-21 (×4): 1 via ORAL
  Filled 2022-01-20 (×4): qty 1

## 2022-01-20 MED ORDER — ACETAMINOPHEN 500 MG PO TABS
500.0000 mg | ORAL_TABLET | Freq: Four times a day (QID) | ORAL | Status: AC
  Administered 2022-01-20 – 2022-01-21 (×3): 500 mg via ORAL
  Filled 2022-01-20 (×3): qty 1

## 2022-01-20 MED ORDER — HYDROMORPHONE HCL 1 MG/ML IJ SOLN
INTRAMUSCULAR | Status: DC | PRN
  Administered 2022-01-20: .5 mg via INTRAVENOUS

## 2022-01-20 MED ORDER — FENTANYL CITRATE (PF) 100 MCG/2ML IJ SOLN
INTRAMUSCULAR | Status: AC
  Administered 2022-01-20: 50 ug via INTRAVENOUS
  Filled 2022-01-20: qty 2

## 2022-01-20 MED ORDER — METOCLOPRAMIDE HCL 5 MG/ML IJ SOLN: 5.0000 mg | Freq: Three times a day (TID) | INTRAMUSCULAR | Status: DC | PRN

## 2022-01-20 MED ORDER — ACETAMINOPHEN 10 MG/ML IV SOLN: 1000.0000 mg | Freq: Once | INTRAVENOUS | Status: DC | PRN

## 2022-01-20 MED ORDER — ONDANSETRON HCL 4 MG/2ML IJ SOLN
INTRAMUSCULAR | Status: AC
  Filled 2022-01-20: qty 2

## 2022-01-20 MED ORDER — DEXAMETHASONE SODIUM PHOSPHATE 10 MG/ML IJ SOLN
INTRAMUSCULAR | Status: DC | PRN
  Administered 2022-01-20: 10 mg via INTRAVENOUS

## 2022-01-20 MED ORDER — PROPOFOL 10 MG/ML IV BOLUS
INTRAVENOUS | Status: DC | PRN
  Administered 2022-01-20: 80 mg via INTRAVENOUS
  Administered 2022-01-20: 120 mg via INTRAVENOUS

## 2022-01-20 MED ORDER — ONDANSETRON HCL 4 MG/2ML IJ SOLN: 4.0000 mg | Freq: Once | INTRAMUSCULAR | Status: DC | PRN

## 2022-01-20 SURGICAL SUPPLY — 48 items
BLADE SURG SZ10 CARB STEEL (BLADE) ×2 IMPLANT
BNDG COHESIVE 4X5 TAN ST LF (GAUZE/BANDAGES/DRESSINGS) ×2 IMPLANT
BNDG ESMARK 4X12 TAN STRL LF (GAUZE/BANDAGES/DRESSINGS) ×2 IMPLANT
CHLORAPREP W/TINT 26 (MISCELLANEOUS) ×2 IMPLANT
CUFF TOURN SGL QUICK 18X4 (TOURNIQUET CUFF) IMPLANT
CUFF TOURN SGL QUICK 24 (TOURNIQUET CUFF)
CUFF TRNQT CYL 24X4X16.5-23 (TOURNIQUET CUFF) IMPLANT
ELECT REM PT RETURN 9FT ADLT (ELECTROSURGICAL) ×2
ELECTRODE REM PT RTRN 9FT ADLT (ELECTROSURGICAL) ×1 IMPLANT
GAUZE SPONGE 4X4 16PLY XRAY LF (GAUZE/BANDAGES/DRESSINGS) ×1 IMPLANT
GLOVE BIO SURGEON STRL SZ8 (GLOVE) ×2 IMPLANT
GLOVE SURG ORTHO 8.5 STRL (GLOVE) ×2 IMPLANT
GLOVE SURG UNDER LTX SZ8 (GLOVE) ×2 IMPLANT
GOWN STRL REUS W/ TWL LRG LVL3 (GOWN DISPOSABLE) ×1 IMPLANT
GOWN STRL REUS W/ TWL XL LVL3 (GOWN DISPOSABLE) ×1 IMPLANT
GOWN STRL REUS W/TWL LRG LVL3 (GOWN DISPOSABLE) ×1
GOWN STRL REUS W/TWL XL LVL3 (GOWN DISPOSABLE) ×1
KIT TURNOVER KIT A (KITS) ×2 IMPLANT
LABEL OR SOLS (LABEL) ×2 IMPLANT
MANIFOLD NEPTUNE II (INSTRUMENTS) ×2 IMPLANT
NDL SAFETY ECLIPSE 18X1.5 (NEEDLE) ×1 IMPLANT
NEEDLE HYPO 18GX1.5 SHARP (NEEDLE) ×1
NS IRRIG 1000ML POUR BTL (IV SOLUTION) ×2 IMPLANT
PACK EXTREMITY ARMC (MISCELLANEOUS) ×2 IMPLANT
PAD ABD DERMACEA PRESS 5X9 (GAUZE/BANDAGES/DRESSINGS) ×2 IMPLANT
PAD CAST CTTN 4X4 STRL (SOFTGOODS) ×1 IMPLANT
PADDING CAST 4IN STRL (MISCELLANEOUS) ×1
PADDING CAST BLEND 4X4 STRL (MISCELLANEOUS) IMPLANT
PADDING CAST COTTON 4X4 STRL (SOFTGOODS) ×2
SLING ARM LRG DEEP (SOFTGOODS) ×1 IMPLANT
SOL .9 NS 3000ML IRR  AL (IV SOLUTION) ×3
SOL .9 NS 3000ML IRR UROMATIC (IV SOLUTION) IMPLANT
SPLINT CAST 1 STEP 4X30 (MISCELLANEOUS) ×1 IMPLANT
SPONGE T-LAP 18X18 ~~LOC~~+RFID (SPONGE) ×2 IMPLANT
STAPLER SKIN PROX 35W (STAPLE) ×2 IMPLANT
STOCKINETTE BIAS CUT 4 980044 (GAUZE/BANDAGES/DRESSINGS) ×2 IMPLANT
STOCKINETTE IMPERVIOUS 9X36 MD (GAUZE/BANDAGES/DRESSINGS) ×2 IMPLANT
SUT ETHILON 4-0 (SUTURE) ×1
SUT ETHILON 4-0 FS2 18XMFL BLK (SUTURE) ×1
SUT VIC AB 2-0 CT1 (SUTURE) ×3 IMPLANT
SUT VIC AB 2-0 CT2 27 (SUTURE) ×1 IMPLANT
SUT VIC AB 4-0 SH 27 (SUTURE) ×1
SUT VIC AB 4-0 SH 27XANBCTRL (SUTURE) ×1 IMPLANT
SUT VICRYL+ 3-0 36IN CT-1 (SUTURE) ×2 IMPLANT
SUTURE ETHLN 4-0 FS2 18XMF BLK (SUTURE) ×1 IMPLANT
SYR 10ML LL (SYRINGE) ×2 IMPLANT
WATER STERILE IRR 1000ML POUR (IV SOLUTION) ×1 IMPLANT
WATER STERILE IRR 500ML POUR (IV SOLUTION) ×2 IMPLANT

## 2022-01-20 NOTE — Progress Notes (Signed)
Patient returned from PACU

## 2022-01-21 ENCOUNTER — Encounter: Payer: Self-pay | Admitting: Surgery

## 2022-01-21 DIAGNOSIS — B955 Unspecified streptococcus as the cause of diseases classified elsewhere: Secondary | ICD-10-CM | POA: Diagnosis not present

## 2022-01-21 DIAGNOSIS — R7881 Bacteremia: Secondary | ICD-10-CM | POA: Diagnosis not present

## 2022-01-21 LAB — CBC
HCT: 29 % — ABNORMAL LOW (ref 36.0–46.0)
Hemoglobin: 9.4 g/dL — ABNORMAL LOW (ref 12.0–15.0)
MCH: 32.8 pg (ref 26.0–34.0)
MCHC: 32.4 g/dL (ref 30.0–36.0)
MCV: 101 fL — ABNORMAL HIGH (ref 80.0–100.0)
Platelets: 105 10*3/uL — ABNORMAL LOW (ref 150–400)
RBC: 2.87 MIL/uL — ABNORMAL LOW (ref 3.87–5.11)
RDW: 13.5 % (ref 11.5–15.5)
WBC: 5.1 10*3/uL (ref 4.0–10.5)
nRBC: 0 % (ref 0.0–0.2)

## 2022-01-21 LAB — MAGNESIUM: Magnesium: 2.3 mg/dL (ref 1.7–2.4)

## 2022-01-21 LAB — BASIC METABOLIC PANEL
Anion gap: 5 (ref 5–15)
BUN: 14 mg/dL (ref 8–23)
CO2: 26 mmol/L (ref 22–32)
Calcium: 8.2 mg/dL — ABNORMAL LOW (ref 8.9–10.3)
Chloride: 108 mmol/L (ref 98–111)
Creatinine, Ser: 0.6 mg/dL (ref 0.44–1.00)
GFR, Estimated: >60 mL/min (ref >60)
Glucose, Bld: 142 mg/dL — ABNORMAL HIGH (ref 70–99)
Potassium: 4.5 mmol/L (ref 3.5–5.1)
Sodium: 139 mmol/L (ref 135–145)

## 2022-01-21 LAB — HUMAN PARVOVIRUS DNA DETECTION BY PCR: Parvovirus B19, PCR: NEGATIVE

## 2022-01-21 LAB — GLUCOSE, CAPILLARY: Glucose-Capillary: 122 mg/dL — ABNORMAL HIGH (ref 70–99)

## 2022-01-21 NOTE — TOC Progression Note (Signed)
Transition of Care [facility]) - Progression Note    Patient Details  Name: Riot Streitmatter MRN: [id number] Date of Birth: 1960/10/25  Transition of Care [facility]) CM/SW Contact  Chapman Fitch, RN Phone Number: 01/21/2022, 3:48 PM  Clinical Narrative:      Reached out to ID to determine if patient will require IV antibiotics at discharge       Expected Discharge Plan and Services                                                 Social Determinants of Health (SDOH) Interventions    Readmission Risk Interventions     No data to display

## 2022-01-22 ENCOUNTER — Inpatient Hospital Stay (HOSPITAL_COMMUNITY)
Admit: 2022-01-22 | Discharge: 2022-01-22 | Disposition: A | Discharge status: Home / Self Care | Payer: BC Managed Care – PPO | Attending: Hospitalist | Admitting: Hospitalist

## 2022-01-22 ENCOUNTER — Inpatient Hospital Stay: Payer: Self-pay

## 2022-01-22 DIAGNOSIS — R7881 Bacteremia: Secondary | ICD-10-CM | POA: Diagnosis not present

## 2022-01-22 DIAGNOSIS — B955 Unspecified streptococcus as the cause of diseases classified elsewhere: Secondary | ICD-10-CM | POA: Diagnosis not present

## 2022-01-22 LAB — CBC
HCT: 27.5 % — ABNORMAL LOW (ref 36.0–46.0)
Hemoglobin: 8.9 g/dL — ABNORMAL LOW (ref 12.0–15.0)
MCH: 32.6 pg (ref 26.0–34.0)
MCHC: 32.4 g/dL (ref 30.0–36.0)
MCV: 100.7 fL — ABNORMAL HIGH (ref 80.0–100.0)
Platelets: 88 10*3/uL — ABNORMAL LOW (ref 150–400)
RBC: 2.73 MIL/uL — ABNORMAL LOW (ref 3.87–5.11)
RDW: 13.5 % (ref 11.5–15.5)
WBC: 4.8 10*3/uL (ref 4.0–10.5)
nRBC: 0 % (ref 0.0–0.2)

## 2022-01-22 LAB — CULTURE, BLOOD (ROUTINE X 2)
Culture: NO GROWTH
Culture: NO GROWTH
Special Requests: ADEQUATE

## 2022-01-22 LAB — BASIC METABOLIC PANEL
Anion gap: 4 — ABNORMAL LOW (ref 5–15)
BUN: 17 mg/dL (ref 8–23)
CO2: 27 mmol/L (ref 22–32)
Calcium: 8.1 mg/dL — ABNORMAL LOW (ref 8.9–10.3)
Chloride: 111 mmol/L (ref 98–111)
Creatinine, Ser: 0.63 mg/dL (ref 0.44–1.00)
GFR, Estimated: >60 mL/min (ref >60)
Glucose, Bld: 96 mg/dL (ref 70–99)
Potassium: 3.7 mmol/L (ref 3.5–5.1)
Sodium: 142 mmol/L (ref 135–145)

## 2022-01-22 LAB — ECHOCARDIOGRAM COMPLETE
Area-P 1/2: 4.33 cm2
Calc EF: 60.1 %
Height: 61 in
S' Lateral: 2.28 cm
Single Plane A2C EF: 57.7 %
Single Plane A4C EF: 61.4 %
Weight: 2420.8 oz

## 2022-01-22 LAB — BODY FLUID CULTURE W GRAM STAIN: Culture: NO GROWTH

## 2022-01-22 LAB — GLUCOSE, CAPILLARY: Glucose-Capillary: 94 mg/dL (ref 70–99)

## 2022-01-22 LAB — MAGNESIUM: Magnesium: 2.3 mg/dL (ref 1.7–2.4)

## 2022-01-22 MED ORDER — SODIUM CHLORIDE 0.9% FLUSH: 10.0000 mL | Freq: Two times a day (BID) | INTRAVENOUS | Status: DC

## 2022-01-22 MED ORDER — CHLORHEXIDINE GLUCONATE CLOTH 2 % EX PADS: 6.0000 | MEDICATED_PAD | Freq: Every day | CUTANEOUS | Status: DC

## 2022-01-22 MED ORDER — SODIUM CHLORIDE 0.9% FLUSH: 10.0000 mL | INTRAVENOUS | Status: DC | PRN

## 2022-01-22 MED ORDER — CEFTRIAXONE IV (FOR PTA / DISCHARGE USE ONLY): 2.0000 g | INTRAVENOUS | 0 refills | Status: AC

## 2022-01-22 NOTE — TOC Transition Note (Signed)
Transition of Care [facility]) - CM/SW Discharge Note   Patient Details  Name: Molly Krueger MRN: [id number] Date of Birth: [birth year]  Transition of Care [facility]) CM/SW Contact:  Chapman Fitch, RN Phone Number: 01/22/2022, 2:51 PM   Clinical Narrative:     Patient to discharge today. Per Dennie Bible will be the home health agency staffing the case at discharge  Pam with [facility] and Kandee Keen with [facility] notified of discharge   Pam has completed bedside education for IV antibiotics with patient and husband  Husband to provide transport at discharge        Patient Goals and CMS Choice        Discharge Placement                       Discharge Plan and Services                                     Social Determinants of Health (SDOH) Interventions     Readmission Risk Interventions     No data to display

## 2022-01-22 NOTE — Discharge Summary (Signed)
Physician Discharge Summary   Molly Krueger  female DOB: [birth year]  QBH:[id number]  PCP: Anne Ng, MD  Admit date: 01/16/2022 Discharge date: 01/22/2022  Admitted From: home Disposition:  home Home Health: Yes CODE STATUS: Full code  Hospital Course:  For full details, please see H&P, progress notes, consult notes and ancillary notes.  Briefly,  Molly Krueger is a 61 y.o. female with no significant past medical history came to ED with complaint of fever, chills and left elbow pain.   * Streptococcal group G bacteremia --1 of 2 blood cx pos strep group G.  1 of 2 pos GPR BACILLUS SPECIES which was deemed a contaminant.   --ID consulted --MRI spine neg.  Left elbow with septic joint. --pt was started on ceftriaxone and will complete 4 weeks at home, end date 02/16/22. --PICC line placed, abx infusion taught to pt and husband and set up at home prior to discharge. --f/u scheduled with ID Dr. Delaine Lame as outpatient.  Septic joint of left elbow ([place]) S/p surgical I/D and washout on 01/20/22 with Dr. Roland Rack --on presentation, ortho attempted needle aspiration of joint fluid, but no return --MRI elbow showed large joint effusion and Periarticular muscle inflammation, most prominently involving the distal triceps muscle where there is a deep intramuscular 2.1 cm abscess just superior to the posterior elbow joint. --IR aspirated joint (on day 3 of abx), sample clotted so cell count couldn't be performed.   --Ortho Dr. Roland Rack performed surgical I/D and washout on 6/25, found gross purulence in multiple areas.  Hemovac removed, and wound checked by ortho prior to discharge.  Pt was instructed to leave the dressing intact, continue with splint until outpatient f/u with ortho next week after discharge.   Sepsis [facility]) Patient met sepsis criteria with fever and tachycardia.  Source bacteremia and septic joint Received IV fluid and broad-spectrum antibiotics per sepsis  protocol in ED.   AKI (acute kidney injury) ([place]) Most likely secondary to poor p.o. intake and dehydration.  Creatinine at 1.21 on presentation.  Improved to 0.61 the next day.   Hyponatremia Mild hyponatremia with sodium of 129, most likely secondary to poor p.o. intake as she appears dry. --improved with IVF.   Hypokalemia --monitored and repleted PRN    Discharge Diagnoses:  Principal Problem:   Streptococcal bacteremia Active Problems:   Sepsis ([place])   AKI (acute kidney injury) ([place])   Hyponatremia   Septic joint of left elbow ([facility])   Hypokalemia   30 Day Unplanned Readmission Risk Score    Flowsheet Row ED to Hosp-Admission (Current) from 01/16/2022 in [place]  30 Day Unplanned Readmission Risk Score (%) 9.37 Filed at 01/22/2022 1200       This score is the patient's risk of an unplanned readmission within 30 days of being discharged (0 -100%). The score is based on dignosis, age, lab data, medications, orders, and past utilization.   Low:  0-14.9   Medium: 15-21.9   High: 22-29.9   Extreme: 30 and above         Discharge Instructions:  Allergies as of 01/22/2022   No Known Allergies      Medication List     TAKE these medications    cefTRIAXone  IVPB Commonly known as: ROCEPHIN Inject 2 g into the vein daily for 24 days. Indication:  Streptococcus group G bacteremia with septic elbow and arm abscess First Dose: Yes Last Day of Therapy:  02/16/2022 Labs -  Once weekly:  CBC/D, CMP, ESR and CRP Please pull PIC at completion of IV antibiotics Fax weekly lab results  promptly to [phone] Method of administration: IV Push Method of administration may be changed at the discretion of home infusion pharmacist based upon assessment of the patient and/or caregiver's ability to self-administer the medication ordered. Start taking on: January 23, 2022               Discharge Care Instructions  (From admission,  onward)           Start     Ordered   01/22/22 0000  Change dressing on IV access line weekly and PRN  (Home infusion instructions - [facility] )        01/22/22 1441             Follow-up Information     Lattie Corns, PA-C Follow up.   Specialty: Physician Assistant Why: Skin check next week. Contact information: [place] [address] [phone]         Anne Ng, MD Follow up in 1 week(s).   Specialty: Family Medicine Contact information: [address] [place] [address] [phone]                 No Known Allergies   The results of significant diagnostics from this hospitalization (including imaging, microbiology, ancillary and laboratory) are listed below for reference.   Consultations:   Procedures/Studies: [place] EKG SITE RITE  Result Date: 01/22/2022 If Site Rite image not attached, placement could not be confirmed due to current cardiac rhythm.  DG FLUORO GUIDED NEEDLE PLC ASPIRATION/INJECTION LOC  Result Date: 01/18/2022 CLINICAL DATA:  Left elbow pain, swelling, effusion EXAM: Left elbow joint arthrocentesis under fluoroscopy COMPARISON:  Same day MRI. FLUOROSCOPY: Radiation Exposure Index (as provided by the fluoroscopic device): 1.00 mGy Kerma PROCEDURE: Informed verbal consent was obtained from the patient after discussion of the risks, benefits and alternatives to treatment. The patient was placed prone on the fluoroscopy table. Appropriate site of procedure was identified and the skin overlying the lateral aspect of the elbow was prepped and draped in usual sterile fashion. The skin was infiltrated locally with 1% Lidocaine. A 18g needle was advanced into the joint and 4cc of synovial fluid with many rice body inclusions was aspirated. Purulence could not be excluded. Needle was removed and site was bandaged. Patient tolerated procedure well. No complications. IMPRESSION:  Technically successful elbow aspiration with 4cc synovial fluid acquired. Read and performed by: Alexandria Lodge, PA-C Electronically Signed   By: Macy Mis M.D.   On: 01/18/2022 14:18   MR ELBOW LEFT W WO CONTRAST  Result Date: 01/18/2022 CLINICAL DATA:  Fever and left elbow pain. Bacteremia. History of aplastic anemia. EXAM: MRI OF THE LEFT ELBOW WITHOUT AND WITH CONTRAST TECHNIQUE: Multiplanar, multisequence MR imaging of the elbow was performed before and after the administration of intravenous contrast. CONTRAST:  7.48m GADAVIST GADOBUTROL 1 MMOL/ML IV SOLN COMPARISON:  Left elbow x-rays dated January 16, 2022. FINDINGS: TENDONS Common forearm flexor origin: Intact. Common forearm extensor origin: Intact. Biceps: Intact. Triceps: Intact. LIGAMENTS Medial stabilizers: The ulnar collateral ligament is intact. Lateral stabilizers: The lateral ulnar and radial collateral ligaments are intact. Cartilage: Preserved.  No focal chondral defect demonstrated. Joint: Large joint effusion with synovial thickening and enhancement. No intra-articular body observed. Cubital tunnel: Unremarkable.  The ulnar nerve appears normal. Bones: No acute or significant extra-articular osseous  findings. Other: Periarticular muscle edema, most prominently involving the distal triceps muscle where there is a 2.1 cm rim enhancing fluid collection within the deep muscle just superior to the posterior elbow joint (series 13, image 14; series 20, image 15). Prominent soft tissue swelling around the elbow. IMPRESSION: 1. Large joint effusion with synovial thickening and enhancement, most consistent with septic arthritis given clinical history. 2. Periarticular muscle inflammation, most prominently involving the distal triceps muscle where there is a deep intramuscular 2.1 cm abscess just superior to the posterior elbow joint. 3. No evidence of osteomyelitis. Electronically Signed   By: Titus Dubin M.D.   On: 01/18/2022 08:02   MR  THORACIC SPINE W WO CONTRAST  Result Date: 01/18/2022 CLINICAL DATA:  Back pain and bacteremia EXAM: MRI THORACIC WITHOUT AND WITH CONTRAST TECHNIQUE: Multiplanar and multiecho pulse sequences of the thoracic spine were obtained without and with intravenous contrast. CONTRAST:  7.69m GADAVIST GADOBUTROL 1 MMOL/ML IV SOLN COMPARISON:  None Available. FINDINGS: Alignment:  Physiologic. Vertebrae: No fracture, evidence of discitis, or bone lesion. No epidural collection. Cord:  Normal signal and morphology. Paraspinal and other soft tissues: Small pleural effusions Disc levels: No spinal canal stenosis. IMPRESSION: No discitis-osteomyelitis or epidural collection. Electronically Signed   By: KUlyses JarredM.D.   On: 01/18/2022 03:45   MR Lumbar Spine W Wo Contrast  Result Date: 01/18/2022 CLINICAL DATA:  Back pain and bacteremia EXAM: MRI LUMBAR SPINE WITHOUT AND WITH CONTRAST TECHNIQUE: Multiplanar and multiecho pulse sequences of the lumbar spine were obtained without and with intravenous contrast. CONTRAST:  7.532mGADAVIST GADOBUTROL 1 MMOL/ML IV SOLN COMPARISON:  None Available. FINDINGS: The examination is degraded by motion. Segmentation: Standard Alignment:  Normal Vertebrae: No fracture, evidence of discitis, or bone lesion. No epidural collection Conus medullaris and cauda equina: Conus extends to the L1 level. Conus and cauda equina appear normal. Paraspinal and other soft tissues: No psoas abscess Disc levels: Motion degraded axial images but no spinal canal stenosis. Neural foramina are patent. IMPRESSION: 1. Motion degraded examination. 2. No epidural collection or discitis/osteomyelitis. 3. No spinal canal or neural foraminal stenosis. Electronically Signed   By: KeUlyses Jarred.D.   On: 01/18/2022 03:35   MR CERVICAL SPINE W WO CONTRAST  Result Date: 01/18/2022 CLINICAL DATA:  Back pain and bacteremia EXAM: MRI CERVICAL SPINE WITHOUT AND WITH CONTRAST TECHNIQUE: Multiplanar and multiecho pulse  sequences of the cervical spine, to include the craniocervical junction and cervicothoracic junction, were obtained without and with intravenous contrast. CONTRAST:  7.26m68mADAVIST GADOBUTROL 1 MMOL/ML IV SOLN COMPARISON:  None Available. FINDINGS: Alignment: Grade 1 anterolisthesis at C3-4. Vertebrae: No fracture, evidence of discitis, or bone lesion. Cord: Normal signal and morphology.  No epidural collection. Posterior Fossa, vertebral arteries, paraspinal tissues: Negative. Disc levels: C1-2: Unremarkable. C2-3: Normal disc space and facet joints. There is no spinal canal stenosis. No neural foraminal stenosis. C3-4: Small disc bulge and uncovertebral spurring. There is no spinal canal stenosis. No neural foraminal stenosis. C4-5: Normal disc space and facet joints. There is no spinal canal stenosis. No neural foraminal stenosis. C5-6: Normal disc space and facet joints. There is no spinal canal stenosis. No neural foraminal stenosis. C6-7: Normal disc space and facet joints. There is no spinal canal stenosis. No neural foraminal stenosis. C7-T1: Normal disc space and facet joints. There is no spinal canal stenosis. No neural foraminal stenosis. IMPRESSION: 1. No discitis-osteomyelitis or epidural collection. 2. Mild degenerative disc disease at C3-4. No spinal canal  or neural foraminal stenosis. Electronically Signed   By: Ulyses Jarred M.D.   On: 01/18/2022 03:16   CT CHEST ABDOMEN PELVIS WO CONTRAST  Result Date: 01/16/2022 CLINICAL DATA:  61 year old female with acute chest, abdominal and pelvic pain with chills and body aches. EXAM: CT CHEST, ABDOMEN AND PELVIS WITHOUT CONTRAST TECHNIQUE: Multidetector CT imaging of the chest, abdomen and pelvis was performed following the standard protocol without IV contrast. RADIATION DOSE REDUCTION: This exam was performed according to the departmental dose-optimization program which includes automated exposure control, adjustment of the mA and/or kV according to  patient size and/or use of iterative reconstruction technique. COMPARISON:  None Available. FINDINGS: Please note that parenchymal and vascular abnormalities may be missed as intravenous contrast was not administered. CT CHEST FINDINGS Cardiovascular: No significant vascular findings. Normal heart size. No pericardial effusion. Mediastinum/Nodes: No enlarged mediastinal, hilar, or axillary lymph nodes. Thyroid gland, trachea, and esophagus demonstrate no significant findings. Lungs/Pleura: Moderate bilateral LOWER lobe opacities cast question atelectasis and/or consolidation/pneumonia. Mild to moderate RIGHT middle lobe and lingular atelectasis noted. Trace bilateral pleural effusions are present. There is no evidence of pneumothorax. Musculoskeletal: No acute or suspicious bony abnormalities are noted. CT ABDOMEN PELVIS FINDINGS Hepatobiliary: The liver is unremarkable. Dependent high density within the gallbladder may represent sludge versus cholelithiasis. No CT evidence of acute cholecystitis identified. There is no evidence of intrahepatic or extrahepatic biliary dilatation. Pancreas: Unremarkable Spleen: UPPER limits normal spleen size noted. Adrenals/Urinary Tract: The kidneys, adrenal glands and bladder are unremarkable except for a probable LEFT renal cyst which need no follow-up. Stomach/Bowel: Stomach is within normal limits. No evidence of bowel wall thickening, distention, or inflammatory changes. Vascular/Lymphatic: Aortic atherosclerosis. No enlarged abdominal or pelvic lymph nodes. Reproductive: Uterus and bilateral adnexa are unremarkable. Other: Stranding/mildly enlarged lymph nodes in the periaortic region identified with an index aortocaval node measuring 1.1 cm (series 2: Image 61). This stranding extends down bilateral posterior retroperitoneal regions and there is a small amount of fluid/stranding in the presacral/pre coccygeal region. There is no evidence of focal collection pneumoperitoneum.  Musculoskeletal: No acute or suspicious bony abnormalities are noted. IMPRESSION: 1. Moderate bilateral LOWER lobe opacities, question atelectasis and/or consolidation/pneumonia. Mild to moderate RIGHT middle lobe and lingular atelectasis and trace bilateral pleural effusions. 2. Stranding/mildly enlarged lymph nodes in the periaortic region extending into the pelvic retroperitoneum regions with a small amount of fluid/stranding in the presacral/pre coccygeal region. These findings are nonspecific but may represent an infectious or inflammatory process. 3. Dependent high density within the gallbladder may represent sludge versus cholelithiasis. No CT evidence of acute cholecystitis. 4. Aortic Atherosclerosis (ICD10-I70.0). Electronically Signed   By: Margarette Canada M.D.   On: 01/16/2022 16:32   DG Elbow Complete Left  Result Date: 01/16/2022 CLINICAL DATA:  Arm pain EXAM: LEFT ELBOW - COMPLETE 4 VIEW COMPARISON:  None Available. FINDINGS: No acute fracture or dislocation. Elbow joint effusion. There is no evidence of arthropathy or other focal bone abnormality. Soft tissues are unremarkable. IMPRESSION: 1. Elbow joint effusion, concerning for infectious or inflammatory etiology given no history of trauma. 2. No acute osseous abnormality. Electronically Signed   By: Yetta Glassman M.D.   On: 01/16/2022 15:17   DG Chest 2 View  Result Date: 01/16/2022 CLINICAL DATA:  Fever x5 days EXAM: CHEST - 2 VIEW COMPARISON:  None Available. FINDINGS: Cardiac size is within normal limits. Thoracic aorta is tortuous. There is poor inspiration. Linear patchy infiltrates are seen in both lower lung fields. There  are no signs of alveolar pulmonary edema. There is possible minimal right pleural effusion. There is no pneumothorax. IMPRESSION: Linear patchy infiltrates are seen in the both lower lung fields suggesting atelectasis/pneumonia. Possible minimal right pleural effusion. Electronically Signed   By: Elmer Picker  M.D.   On: 01/16/2022 15:14   US Venous Img Upper Uni Left  Result Date: 01/16/2022 CLINICAL DATA:  swelling left upper extremity with pain EXAM: Left UPPER EXTREMITY VENOUS DOPPLER ULTRASOUND TECHNIQUE: Gray-scale sonography with graded compression, as well as color Doppler and duplex ultrasound were performed to evaluate the upper extremity deep venous system from the level of the subclavian vein and including the jugular, axillary, basilic, radial, ulnar and upper cephalic vein. Spectral Doppler was utilized to evaluate flow at rest and with distal augmentation maneuvers. COMPARISON:  None Available. FINDINGS: Contralateral Subclavian Vein: Respiratory phasicity is normal and symmetric with the symptomatic side. No evidence of thrombus. Normal compressibility. Internal Jugular Vein: No evidence of thrombus. Normal compressibility, respiratory phasicity and response to augmentation. Subclavian Vein: No evidence of thrombus. Normal compressibility, respiratory phasicity and response to augmentation. Axillary Vein: No evidence of thrombus. Normal compressibility, respiratory phasicity and response to augmentation. Cephalic Vein: No evidence of thrombus. Normal compressibility, respiratory phasicity and response to augmentation. Basilic Vein: No evidence of thrombus. Normal compressibility, respiratory phasicity and response to augmentation. Brachial Veins: No evidence of thrombus. Normal compressibility, respiratory phasicity and response to augmentation. Radial Veins: No evidence of thrombus. Normal compressibility, respiratory phasicity and response to augmentation. Ulnar Veins: No evidence of thrombus. Normal compressibility, respiratory phasicity and response to augmentation. Venous Reflux:  None visualized. Other Findings:  None visualized. IMPRESSION: No evidence of DVT within the left upper extremity. Electronically Signed   By: Iven Finn M.D.   On: 01/16/2022 15:01      Labs: BNP (last 3  results) No results for input(s): "BNP" in the last 8760 hours. Basic Metabolic Panel: Recent Labs  Lab 01/18/22 0446 01/19/22 0616 01/20/22 0642 01/21/22 0511 01/22/22 0434  NA 138 137 140 139 142  K 3.2* 3.7 3.4* 4.5 3.7  CL 110 107 108 108 111  CO2 23 25 26 26 27   GLUCOSE 138* 119* 121* 142* 96  BUN 9 11 11 14 17   CREATININE 0.61 0.62 0.59 0.60 0.63  CALCIUM 8.1* 8.4* 8.5* 8.2* 8.1*  MG 2.2 2.2 2.2 2.3 2.3   Liver Function Tests: Recent Labs  Lab 01/16/22 1130 01/18/22 0446  AST 27 30  ALT 52* 37  ALKPHOS 98 96  BILITOT 1.3* 0.7  PROT 7.1 6.1*  ALBUMIN 3.2* 2.8*   No results for input(s): "LIPASE", "AMYLASE" in the last 168 hours. No results for input(s): "AMMONIA" in the last 168 hours. CBC: Recent Labs  Lab 01/18/22 0446 01/19/22 0616 01/20/22 0642 01/21/22 0511 01/22/22 0434  WBC 5.6 5.0 5.3 5.1 4.8  HGB 9.9* 9.6* 9.9* 9.4* 8.9*  HCT 28.4* 28.7* 29.7* 29.0* 27.5*  MCV 95.6 97.3 98.7 101.0* 100.7*  PLT 60* 83* 91* 105* 88*   Cardiac Enzymes: Recent Labs  Lab 01/16/22 1130  CKTOTAL 33*   BNP: Invalid input(s): "POCBNP" CBG: Recent Labs  Lab 01/17/22 0803 01/19/22 0814 01/21/22 0828 01/22/22 0743  GLUCAP 96 111* 122* 94   D-Dimer No results for input(s): "DDIMER" in the last 72 hours. Hgb A1c No results for input(s): "HGBA1C" in the last 72 hours. Lipid Profile No results for input(s): "CHOL", "HDL", "LDLCALC", "TRIG", "CHOLHDL", "LDLDIRECT" in the last 72 hours. Thyroid  function studies No results for input(s): "TSH", "T4TOTAL", "T3FREE", "THYROIDAB" in the last 72 hours.  Invalid input(s): "FREET3" Anemia work up No results for input(s): "VITAMINB12", "FOLATE", "FERRITIN", "TIBC", "IRON", "RETICCTPCT" in the last 72 hours. Urinalysis    Component Value Date/Time   COLORURINE YELLOW (A) 01/16/2022 1130   APPEARANCEUR CLOUDY (A) 01/16/2022 1130   LABSPEC 1.019 01/16/2022 1130   PHURINE 5.0 01/16/2022 1130   GLUCOSEU NEGATIVE  01/16/2022 1130   HGBUR MODERATE (A) 01/16/2022 1130   BILIRUBINUR NEGATIVE 01/16/2022 1130   KETONESUR 5 (A) 01/16/2022 1130   PROTEINUR 100 (A) 01/16/2022 1130   NITRITE NEGATIVE 01/16/2022 1130   LEUKOCYTESUR LARGE (A) 01/16/2022 1130   Sepsis Labs Recent Labs  Lab 01/19/22 0616 01/20/22 0642 01/21/22 0511 01/22/22 0434  WBC 5.0 5.3 5.1 4.8   Microbiology Recent Results (from the past 240 hour(s))  Resp Panel by RT-PCR (Flu A&B, Covid) Anterior Nasal Swab     Status: None   Collection Time: 01/15/22  5:30 PM   Specimen: Anterior Nasal Swab  Result Value Ref Range Status   SARS Coronavirus 2 by RT PCR NEGATIVE NEGATIVE Final    Comment: (NOTE) SARS-CoV-2 target nucleic acids are NOT DETECTED.  The SARS-CoV-2 RNA is generally detectable in upper respiratory specimens during the acute phase of infection. The lowest concentration of SARS-CoV-2 viral copies this assay can detect is 138 copies/mL. A negative result does not preclude SARS-Cov-2 infection and should not be used as the sole basis for treatment or other patient management decisions. A negative result may occur with  improper specimen collection/handling, submission of specimen other than nasopharyngeal swab, presence of viral mutation(s) within the areas targeted by this assay, and inadequate number of viral copies(<138 copies/mL). A negative result must be combined with clinical observations, patient history, and epidemiological information. The expected result is Negative.  Fact Sheet for Patients:  EntrepreneurPulse.com.au  Fact Sheet for Healthcare Providers:  IncredibleEmployment.be  This test is no t yet approved or cleared by the [place] FDA and  has been authorized for detection and/or diagnosis of SARS-CoV-2 by FDA under an Emergency Use Authorization (EUA). This EUA will remain  in effect (meaning this test can be used) for the duration of the COVID-19  declaration under Section 564(b)(1) of the Act, 21 U.S.C.section 360bbb-3(b)(1), unless the authorization is terminated  or revoked sooner.       Influenza A by PCR NEGATIVE NEGATIVE Final   Influenza B by PCR NEGATIVE NEGATIVE Final    Comment: (NOTE) The Xpert Xpress SARS-CoV-2/FLU/RSV plus assay is intended as an aid in the diagnosis of influenza from Nasopharyngeal swab specimens and should not be used as a sole basis for treatment. Nasal washings and aspirates are unacceptable for Xpert Xpress SARS-CoV-2/FLU/RSV testing.  Fact Sheet for Patients: EntrepreneurPulse.com.au  Fact Sheet for Healthcare Providers: IncredibleEmployment.be  This test is not yet approved or cleared by the [place] FDA and has been authorized for detection and/or diagnosis of SARS-CoV-2 by FDA under an Emergency Use Authorization (EUA). This EUA will remain in effect (meaning this test can be used) for the duration of the COVID-19 declaration under Section 564(b)(1) of the Act, 21 U.S.C. section 360bbb-3(b)(1), unless the authorization is terminated or revoked.  Performed at [facility], [address], [place], [place] [address]   Urine Culture     Status: Abnormal   Collection Time: 01/16/22 11:30 AM   Specimen: Urine, Random  Result Value Ref Range Status  Specimen Description   Final    URINE, RANDOM Performed at [facility], [address], [place], [place] [address]    Special Requests   Final    NONE Performed at [facility], [address] [place], [place] [address]    Culture MULTIPLE SPECIES PRESENT, SUGGEST RECOLLECTION (A)  Final   Report Status 01/18/2022 FINAL  Final  Blood culture (routine x 2)     Status: Abnormal   Collection Time: 01/16/22  1:58 PM   Specimen: BLOOD  Result Value Ref Range Status   Specimen Description   Final    BLOOD RIGHT WRIST Performed at [facility], [address], [place], [place] [address]    Special Requests   Final    BOTTLES DRAWN AEROBIC AND ANAEROBIC Blood Culture results may not be optimal due to an inadequate volume of blood received in culture bottles Performed at [facility], [address] [place], [place] [address]    Culture  Setup Time   Final    Organism ID to follow [place] CRITICAL RESULT CALLED TO, READ BACK BY AND VERIFIED WITH: JASON ROBBINS @ 0139 ON 01/17/2022.Marland KitchenMarland KitchenTKR    Culture STREPTOCOCCUS GROUP G (A)  Final   Report Status 01/19/2022 FINAL  Final   Organism ID, Bacteria STREPTOCOCCUS GROUP G  Final      Susceptibility   Streptococcus group g - MIC*    CLINDAMYCIN <=0.25 SENSITIVE Sensitive     AMPICILLIN <=0.25 SENSITIVE Sensitive     ERYTHROMYCIN <=0.12 SENSITIVE Sensitive     VANCOMYCIN 0.5 SENSITIVE Sensitive     CEFTRIAXONE <=0.12 SENSITIVE Sensitive     LEVOFLOXACIN 0.5 SENSITIVE Sensitive     * STREPTOCOCCUS GROUP G  Blood culture (routine x 2)     Status: Abnormal   Collection Time: 01/16/22  1:58 PM   Specimen: BLOOD  Result Value Ref Range Status   Specimen Description   Final    BLOOD LEFT HAND Performed at [facility], [address] [place], [place] [address]    Special Requests   Final    BOTTLES DRAWN AEROBIC AND ANAEROBIC Blood Culture results may not be optimal due to an inadequate volume of blood received in culture bottles Performed at [facility], [address] [place], [place] [address]    Culture  Setup Time   Final    Organism ID to follow ANAEROBIC BOTTLE ONLY [place] TO, READ BACK BY AND VERIFIED WITHLu Duffel [facility] [id number] 01/17/22 HNM Performed at [facility], [address] [place], [place] [address]    Culture (A)  Final    BACILLUS SPECIES Standardized susceptibility testing for this organism is not available. Performed at [facility],  [place] [address], [place],  [address]    Report Status 01/19/2022 FINAL  Final  Blood Culture ID Panel (Reflexed)     Status: Abnormal   Collection Time: 01/16/22  1:58 PM  Result Value Ref Range Status   Enterococcus faecalis NOT DETECTED NOT DETECTED Final   Enterococcus Faecium NOT DETECTED NOT DETECTED Final   Listeria monocytogenes NOT DETECTED NOT DETECTED Final   Staphylococcus species NOT DETECTED NOT DETECTED Final   Staphylococcus aureus (BCID) NOT DETECTED NOT DETECTED Final   Staphylococcus epidermidis NOT DETECTED NOT DETECTED Final   Staphylococcus lugdunensis NOT DETECTED NOT DETECTED Final   Streptococcus species DETECTED (A) NOT DETECTED Final  Comment: Not Enterococcus species, Streptococcus agalactiae, Streptococcus pyogenes, or Streptococcus pneumoniae. CRITICAL RESULT CALLED TO, READ BACK BY AND VERIFIED WITH: JASON ROBBINS @ 0139 ON 01/17/2022.Marland KitchenMarland KitchenTKR    Streptococcus agalactiae NOT DETECTED NOT DETECTED Final   Streptococcus pneumoniae NOT DETECTED NOT DETECTED Final   Streptococcus pyogenes NOT DETECTED NOT DETECTED Final   A.calcoaceticus-baumannii NOT DETECTED NOT DETECTED Final   Bacteroides fragilis NOT DETECTED NOT DETECTED Final   Enterobacterales NOT DETECTED NOT DETECTED Final   Enterobacter cloacae complex NOT DETECTED NOT DETECTED Final   Escherichia coli NOT DETECTED NOT DETECTED Final   Klebsiella aerogenes NOT DETECTED NOT DETECTED Final   Klebsiella oxytoca NOT DETECTED NOT DETECTED Final   Klebsiella pneumoniae NOT DETECTED NOT DETECTED Final   Proteus species NOT DETECTED NOT DETECTED Final   Salmonella species NOT DETECTED NOT DETECTED Final   Serratia marcescens NOT DETECTED NOT DETECTED Final   Haemophilus influenzae NOT DETECTED NOT DETECTED Final   Neisseria meningitidis NOT DETECTED NOT DETECTED Final   Pseudomonas aeruginosa NOT DETECTED NOT DETECTED Final   Stenotrophomonas maltophilia NOT DETECTED NOT DETECTED Final   Candida  albicans NOT DETECTED NOT DETECTED Final   Candida auris NOT DETECTED NOT DETECTED Final   Candida glabrata NOT DETECTED NOT DETECTED Final   Candida krusei NOT DETECTED NOT DETECTED Final   Candida parapsilosis NOT DETECTED NOT DETECTED Final   Candida tropicalis NOT DETECTED NOT DETECTED Final   Cryptococcus neoformans/gattii NOT DETECTED NOT DETECTED Final    Comment: Performed at [facility], [address] [place], [place] [address]  Culture, blood (Routine X 2) w Reflex to ID Panel     Status: None   Collection Time: 01/17/22  2:46 PM   Specimen: BLOOD  Result Value Ref Range Status   Specimen Description BLOOD [facility]  Final   Special Requests BOTTLES DRAWN AEROBIC AND ANAEROBIC BCLV  Final   Culture   Final    NO GROWTH 5 DAYS Performed at [facility], [address] [place], [place] [address]    Report Status 01/22/2022 FINAL  Final  Culture, blood (Routine X 2) w Reflex to ID Panel     Status: None   Collection Time: 01/17/22  4:27 PM   Specimen: BLOOD  Result Value Ref Range Status   Specimen Description BLOOD BLOOD LEFT HAND  Final   Special Requests   Final    BOTTLES DRAWN AEROBIC AND ANAEROBIC Blood Culture adequate volume   Culture   Final    NO GROWTH 5 DAYS Performed at [facility], [address] [place], [place] [address]    Report Status 01/22/2022 FINAL  Final  Body fluid culture w Gram Stain     Status: None   Collection Time: 01/18/22  1:15 PM   Specimen: Synovium; Body Fluid  Result Value Ref Range Status   Specimen Description SYNOVIAL  Final   Special Requests LEFT ELBOW  Final   Gram Stain   Final    FEW WBC PRESENT,BOTH PMN AND MONONUCLEAR NO ORGANISMS SEEN    Culture   Final    NO GROWTH 3 DAYS Performed at [facility], [address], [place], [place] [address]    Report Status 01/22/2022 FINAL  Final  Aerobic/Anaerobic Culture w Gram Stain (surgical/deep wound)     Status: None (Preliminary result)    Collection Time: 01/20/22  8:07 AM   Specimen: PATH Other; Tissue  Result Value Ref Range Status   Specimen Description  Final    TISSUE SEPTIC LEFT ELBOW Performed at [facility], [address], [place], [place] [address]    Special Requests   Final    NONE Performed at [facility], [address] [place], [place] [address]    Gram Stain   Final    RARE WBC PRESENT, PREDOMINANTLY MONONUCLEAR RARE GRAM POSITIVE COCCI IN PAIRS IN CHAINS Performed at [facility], [place] [address] [place], [place] [address]    Culture   Final    RARE STREPTOCOCCUS GROUP G Beta hemolytic streptococci are predictably susceptible to penicillin and other beta lactams. Susceptibility testing not routinely performed. NO ANAEROBES ISOLATED; CULTURE IN PROGRESS FOR 5 DAYS    Report Status PENDING  Incomplete  Aerobic/Anaerobic Culture w Gram Stain (surgical/deep wound)     Status: None (Preliminary result)   Collection Time: 01/20/22  8:11 AM   Specimen: PATH Other; Tissue  Result Value Ref Range Status   Specimen Description   Final    TISSUE LEFT Tyson Alias Performed at [facility], [address], [place], [place] [address]    Special Requests   Final    NONE Performed at [facility], [address] [place], [place] [address]    Gram Stain   Final    RARE WBC PRESENT,BOTH PMN AND MONONUCLEAR NO ORGANISMS SEEN Performed at [facility], [place] [address], [place], [place] [address]    Culture   Final    RARE STREPTOCOCCUS GROUP G Beta hemolytic streptococci are predictably susceptible to penicillin and other beta lactams. Susceptibility testing not routinely performed. NO ANAEROBES ISOLATED; CULTURE IN PROGRESS FOR 5 DAYS    Report Status PENDING  Incomplete     Total time spend on discharging this patient, including the last patient exam, discussing the hospital stay, instructions for ongoing care as it relates to all pertinent  caregivers, as well as preparing the medical discharge records, prescriptions, and/or referrals as applicable, is 50 minutes.    Enzo Bi, MD  [facility] 01/22/2022, 2:42 PM

## 2022-01-25 LAB — AEROBIC/ANAEROBIC CULTURE W GRAM STAIN (SURGICAL/DEEP WOUND)

## 2022-02-05 ENCOUNTER — Encounter: Payer: Self-pay | Admitting: Infectious Diseases

## 2022-02-05 ENCOUNTER — Ambulatory Visit: Payer: BC Managed Care – PPO | Attending: Infectious Diseases | Admitting: Infectious Diseases

## 2022-02-05 VITALS — BP 125/82 | HR 76 | Temp 97.1°F | Ht 62.0 in | Wt 138.0 lb

## 2022-02-05 DIAGNOSIS — D619 Aplastic anemia, unspecified: Secondary | ICD-10-CM | POA: Diagnosis not present

## 2022-02-05 DIAGNOSIS — B951 Streptococcus, group B, as the cause of diseases classified elsewhere: Secondary | ICD-10-CM | POA: Insufficient documentation

## 2022-02-05 DIAGNOSIS — Z792 Long term (current) use of antibiotics: Secondary | ICD-10-CM | POA: Insufficient documentation

## 2022-02-05 DIAGNOSIS — M00222 Other streptococcal arthritis, left elbow: Secondary | ICD-10-CM | POA: Diagnosis not present

## 2022-02-05 DIAGNOSIS — M60001 Infective myositis, unspecified left arm: Secondary | ICD-10-CM | POA: Insufficient documentation

## 2022-02-05 DIAGNOSIS — D696 Thrombocytopenia, unspecified: Secondary | ICD-10-CM | POA: Diagnosis not present

## 2022-02-05 DIAGNOSIS — B954 Other streptococcus as the cause of diseases classified elsewhere: Secondary | ICD-10-CM | POA: Diagnosis not present

## 2022-02-05 MED ORDER — AMOXICILLIN-POT CLAVULANATE 875-125 MG PO TABS: 1.0000 | ORAL_TABLET | Freq: Two times a day (BID) | ORAL | 0 refills | Status: AC

## 2022-02-05 NOTE — Progress Notes (Signed)
NAME: Molly Krueger  DOB: Feb 26, 1961  MRN: [id number]  Date/Time: 02/05/2022 9:31 AM  Subjective:   ? Molly Krueger is a 61 y.o. with a history of aplastic anemia,( as per patient) thrombocytopenia was in [facility] recently for left elbow swelling, pain, fever and had streptococcus Group G bacteremia and underwent I/D of the left elbiw and abscess and it was the same bacteria cultured- She was sent home on Iv ceftriaxone for a total of 4 weeks to complete on 02/16/22 Pt is doing better The sutures were removed No pain or swelling of the elbow No fever Has a catch like sharp pain on her left lower chest sometimes No sob , no cough   PMH Aplastic anemia ( as per patient) Thrombocytopenia Past Surgical History:  Procedure Laterality Date   I & D EXTREMITY Left 01/20/2022   Procedure: IRRIGATION AND DEBRIDEMENT LEFT ELBOW;  Surgeon: Corky Mull, MD;  Location: [facility] ORS;  Service: Orthopedics;  Laterality: Left;   KNEE SURGERY Left    KNEE SURGERY  2020   2020-right,    Social History   Socioeconomic History   Marital status: Married    Spouse name: Not on file   Number of children: Not on file   Years of education: Not on file   Highest education level: Not on file  Occupational History   Not on file  Tobacco Use   Smoking status: Never   Smokeless tobacco: Never  Vaping Use   Vaping Use: Never used  Substance and Sexual Activity   Alcohol use: Never   Drug use: Never   Sexual activity: Not on file  Other Topics Concern   Not on file  Social History Narrative   Not on file   Social Determinants of Health   Financial Resource Strain: Not on file  Food Insecurity: Not on file  Transportation Needs: Not on file  Physical Activity: Not on file  Stress: Not on file  Social Connections: Not on file  Intimate Partner Violence: Not on file    No family history on file. No Known Allergies I? Current Outpatient Medications  Medication Sig Dispense Refill    cefTRIAXone (ROCEPHIN) IVPB Inject 2 g into the vein daily for 24 days. Indication:  Streptococcus group G bacteremia with septic elbow and arm abscess First Dose: Yes Last Day of Therapy:  02/16/2022 Labs - Once weekly:  CBC/D, CMP, ESR and CRP Please pull PIC at completion of IV antibiotics Fax weekly lab results  promptly to [phone] Method of administration: IV Push Method of administration may be changed at the discretion of home infusion pharmacist based upon assessment of the patient and/or caregiver's ability to self-administer the medication ordered. 25 Units 0   ibuprofen (ADVIL) 800 MG tablet Take 800 mg by mouth 3 (three) times daily as needed.     No current facility-administered medications for this visit.     Abtx:  Anti-infectives (From admission, onward)    None       REVIEW OF SYSTEMS:  Const: negative fever, negative chills, negative weight loss Eyes: negative diplopia or visual changes, negative eye pain ENT: negative coryza, negative sore throat Resp: negative cough, hemoptysis, dyspnea Cards: negative for chest pain, palpitations, lower extremity edema GU: negative for frequency, dysuria and hematuria GI: Negative for abdominal pain, diarrhea, bleeding, constipation Skin: negative for rash and pruritus Heme: negative for easy bruising and gum/nose bleeding MS: as above Neurolo:negative for headaches, dizziness, vertigo, memory problems  Psych:  negative for feelings of anxiety, depression  Endocrine: negative for thyroid, diabetes Allergy/Immunology- as above Objective:  VITALS:  BP 125/82   Pulse 76   Temp (!) 97.1 F (36.2 C) (Temporal)   Ht 5' 2" (1.575 m)   Wt 138 lb (62.6 kg)   BMI 25.24 kg/m  LDA Rt PICC PHYSICAL EXAM:  General: Alert, cooperative, no distress, appears stated age.  Head: Normocephalic, without obvious abnormality, atraumatic. Eyes: Conjunctivae clear, anicteric sclerae. Pupils are equal ENT Nares normal. No drainage  or sinus tenderness. Lips, mucosa, and tongue normal. No Thrush Neck: Supple, symmetrical, no adenopathy, thyroid: non tender no carotid bruit and no JVD. Back: No CVA tenderness. Lungs: b/l air entry- few basal crepts Heart: Regular rate and rhythm, no murmur, rub or gallop. Abdomen: Soft, non-tender,not distended. Bowel sounds normal. No masses Extremities: atraumatic, no cyanosis. No edema. No clubbing Skin: No rashes or lesions. Or bruising Lymph: Cervical, supraclavicular normal. Neurologic: Grossly non-focal Pertinent Labs Lab Results 02/04/22 labs pending Plat 166 ESR 57 ? Impression/Recommendation streptococcus Group G bacteremia with left elbow septic arthritis and abscess lower triceps- s/p I/D On ceftriaxone- doing well Will complete Iv antibiotic on 02/14/22 instead of 02/16/22 ( as she is moving homes on 02/15/22) and remove PICC on 02/14/22 Will give a week of augmentin  Informed advance home infusion about early finishing of Iv ceftriaxone  Left lower back catch- could be from the atelectasis on that side - ( MRI lumbar/thoracic spine was normal in the hospital) For incentive spirometry- if it gets worse or other symptoms lije OSB, fever she needs to go to her PCP or ED  Thrombocytopenia ( as per patient aplastic anemia) Pt says she will follow up with her PCP   ? ? ___________________________________________________ Discussed with patient in detail- follow PRN Note:  This document was prepared using Dragon voice recognition software and may include unintentional dictation errors.

## 2022-02-05 NOTE — Patient Instructions (Signed)
You are here for follow up of the left elbow septic arthritis and abscess- you had I/D and the sutures have been removed and the wound is healing well- you will complete 4 weeks of Iv ceftriaxone on 02/16/22- As you are moving homes on 02/15/22 the antibiotic can be finished earlier on 02/14/22 and PICC removed- We can give you augmentin to be taken for 1 week instead. You complain of a catch like pain  on the left side of the lower chest- As you are splinting your lungs  and have atelectasis  please do incentive spirometry. If it does not get better or you develop fever or shortness of breath contact your PCP or go the ED .

## 2022-02-06 ENCOUNTER — Telehealth: Payer: Self-pay

## 2022-02-06 NOTE — Telephone Encounter (Signed)
Patient states she had a rough night and not feeling well this morning. Patient states she is having increased SOB and has been doing the breathing exercises discussed yesterday at her appointment. Patient reports feeling very fatigue. Patient would like to know if her symptoms are due to the ceftriaxone. Taresa [place] T Pricilla Loveless

## 2022-02-06 NOTE — Telephone Encounter (Signed)
Second attempt to reach patient, no answer. Message left with previous attempt.   Syniyah Bourne D Miliano Cotten, RN  

## 2022-02-06 NOTE — Telephone Encounter (Signed)
Received voicemail from patient requesting pain medication. Called her to advise that Dr. Rivka Safer is unable to prescribe this.   Also need to relay previous message from Dr. Rivka Safer. Patient did not answer. Left HIPAA compliant voicemail requesting callback.   Sandie Ano, RN

## 2022-02-06 NOTE — Telephone Encounter (Signed)
Patient returned call and advised of Dr. Lynne Logan recommendations that antibiotics are not likely to cause symptoms and to follow up with PCP for additional workup. Patient agrees to recommendations and agrees to follow up with PCP.  Molly Krueger
